# Patient Record
Sex: Male | Born: 1970 | Race: White | Hispanic: No | Marital: Married | Smoking: Never smoker
Health system: Southern US, Community
[De-identification: ages and names within clinical notes are randomized; demographics above are authoritative.]

## PROBLEM LIST (undated history)

## (undated) DIAGNOSIS — M1990 Unspecified osteoarthritis, unspecified site: Secondary | ICD-10-CM

## (undated) DIAGNOSIS — I10 Essential (primary) hypertension: Secondary | ICD-10-CM

## (undated) DIAGNOSIS — G4730 Sleep apnea, unspecified: Secondary | ICD-10-CM

## (undated) DIAGNOSIS — E669 Obesity, unspecified: Secondary | ICD-10-CM

## (undated) DIAGNOSIS — T7840XA Allergy, unspecified, initial encounter: Secondary | ICD-10-CM

## (undated) HISTORY — DX: Allergy, unspecified, initial encounter: T78.40XA

## (undated) HISTORY — DX: Unspecified osteoarthritis, unspecified site: M19.90

## (undated) HISTORY — DX: Essential (primary) hypertension: I10

## (undated) HISTORY — PX: NASAL SINUS SURGERY: SHX719

## (undated) HISTORY — DX: Obesity, unspecified: E66.9

---

## 2007-01-20 ENCOUNTER — Encounter: Admission: RE | Admit: 2007-01-20 | Discharge: 2007-01-20 | Payer: Self-pay | Admitting: Family Medicine

## 2007-03-10 ENCOUNTER — Ambulatory Visit (HOSPITAL_COMMUNITY): Admission: RE | Admit: 2007-03-10 | Discharge: 2007-03-11 | Payer: Self-pay | Admitting: Otolaryngology

## 2009-08-28 IMAGING — US US SCROTUM
1 series · 14 of 25 positions shown · non-contrast
Comparison: none

CLINICAL DATA: Nodularity of right superior scrotum. 
 SCROTAL ULTRASOUND:
 DOPPLER ULTRASOUND OF THE TESTICLES:
TECHNIQUE: Complete ultrasound examination of the testicles, epididymis, and other scrotal structures was performed.  Color and spectral Doppler ultrasound were also utilized to evaluate blood flow to the testicles.

[Series 1: us scrotum · 0.08mm/px · 14 of 52 slices shown]
[im 1/52]
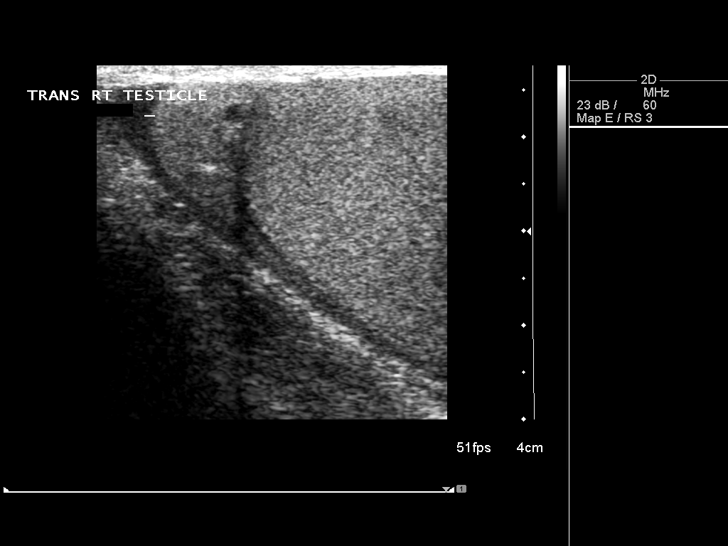
[im 5/52]
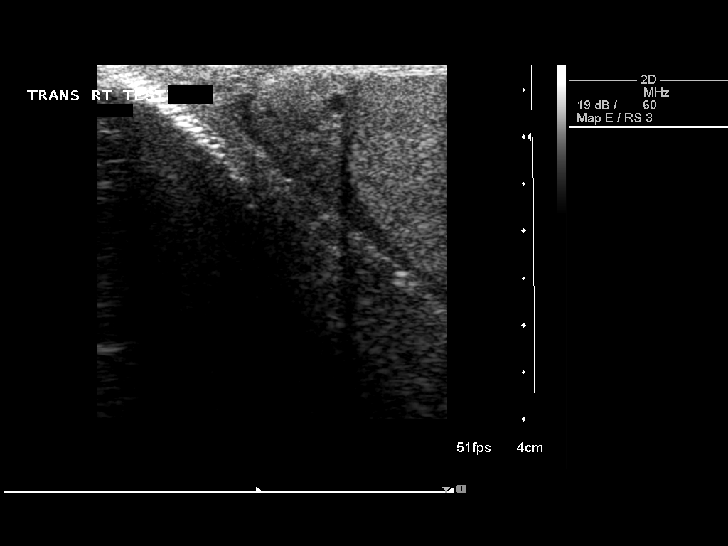
[im 9/52]
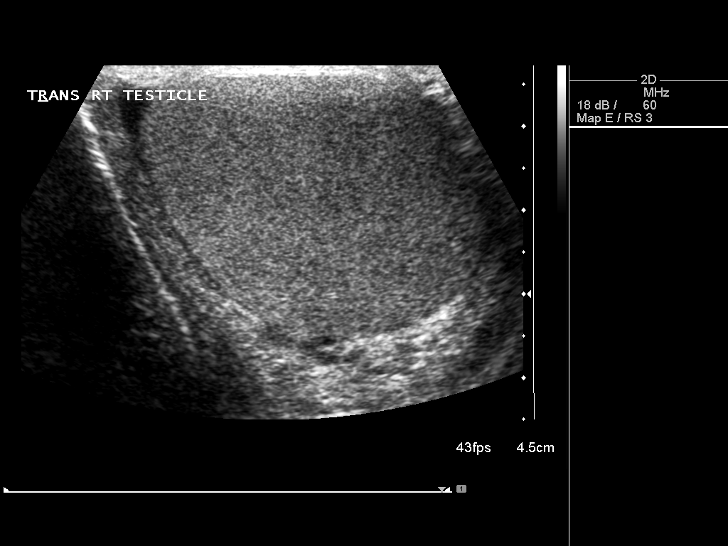
[im 13/52]
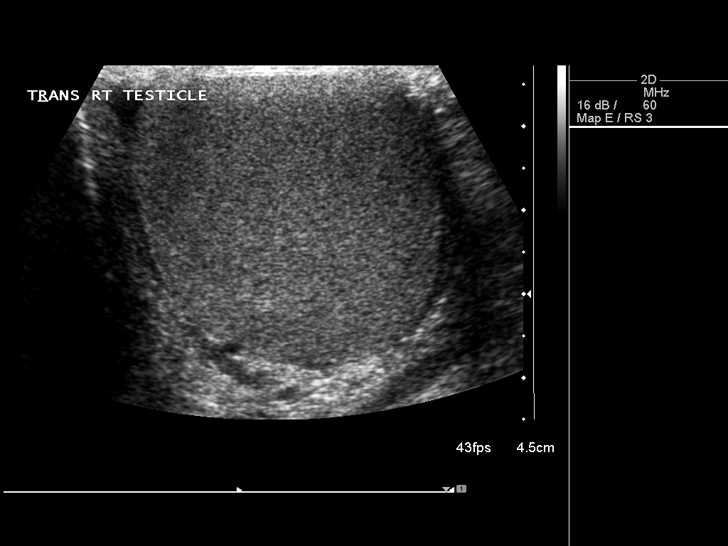
[im 18/52]
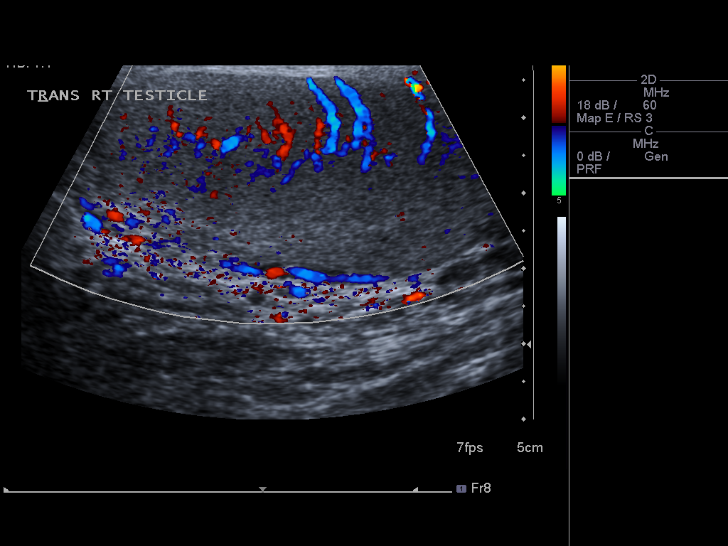
[im 20/52]
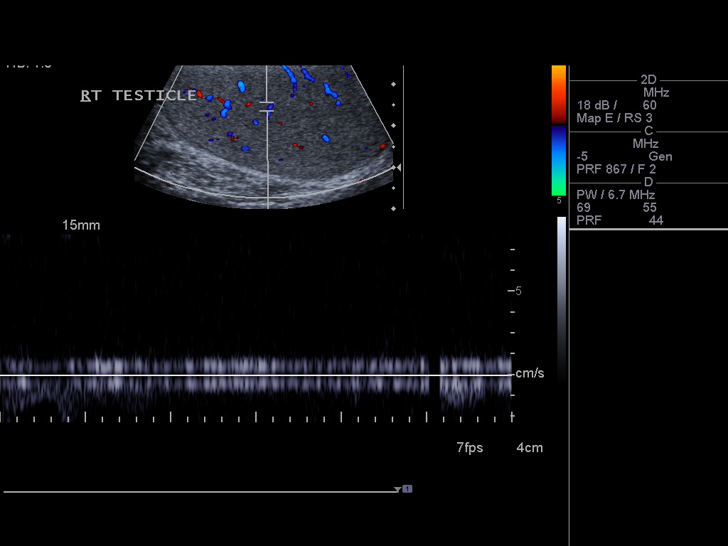
[im 24/52]
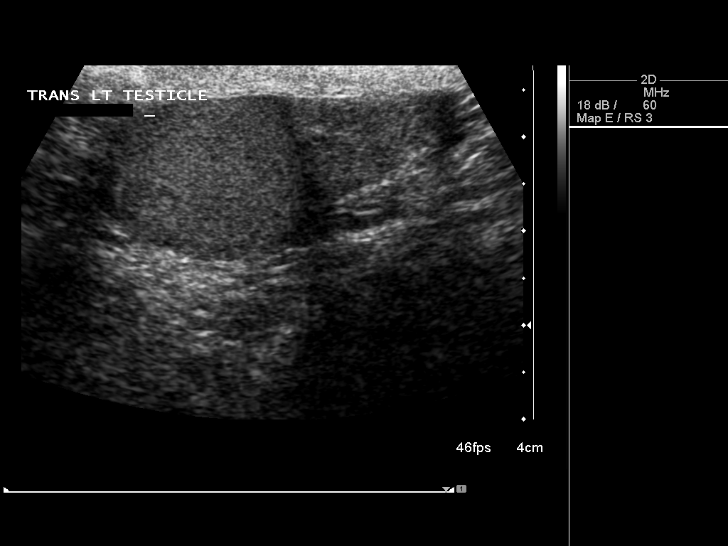
[im 28/52]
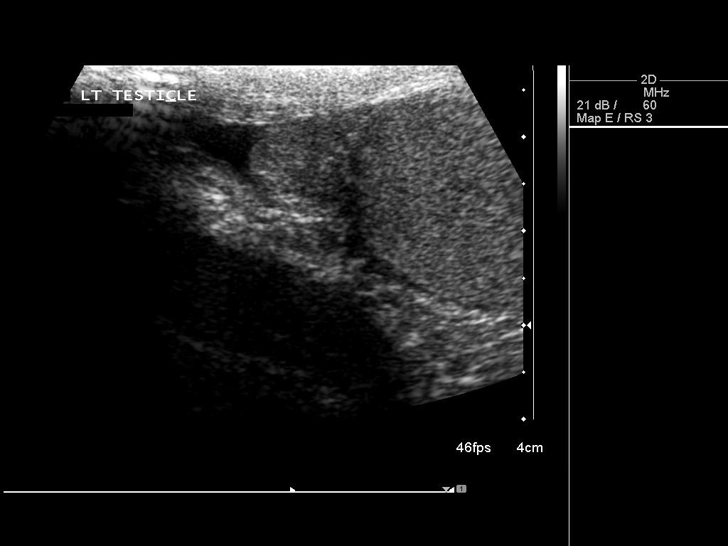
[im 32/52]
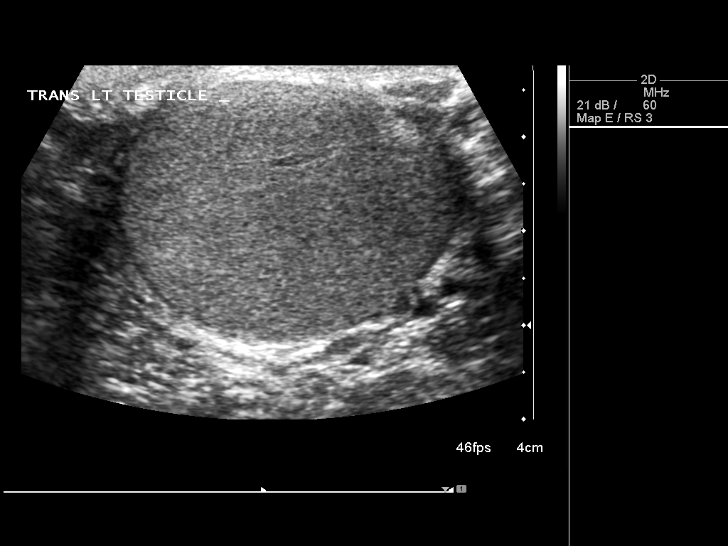
[im 35/52]
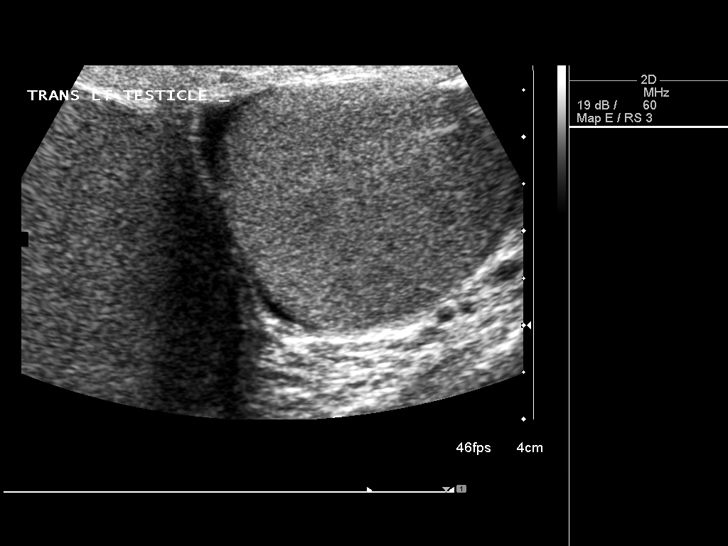
[im 39/52]
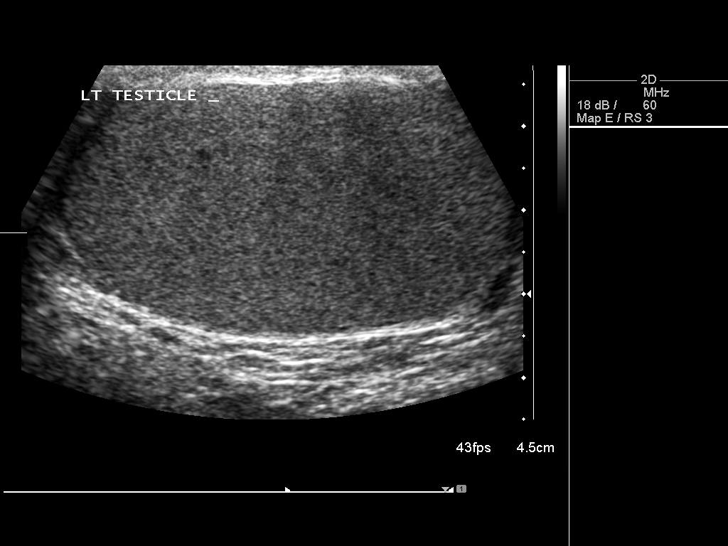
[im 43/52]
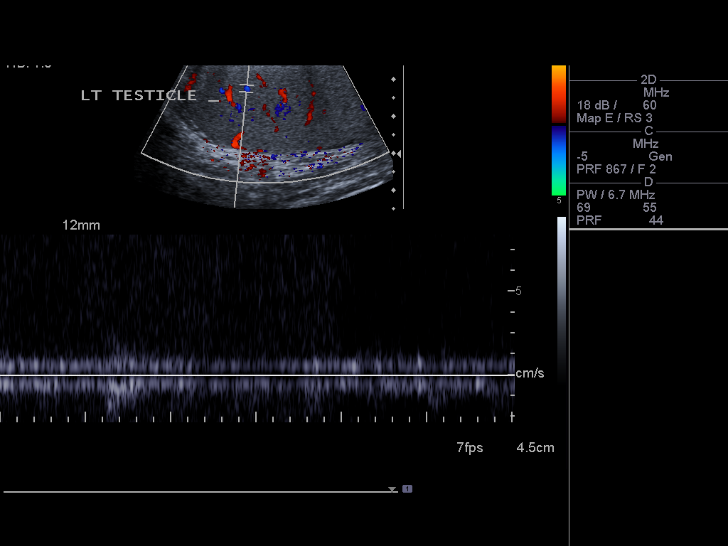
[im 47/52]
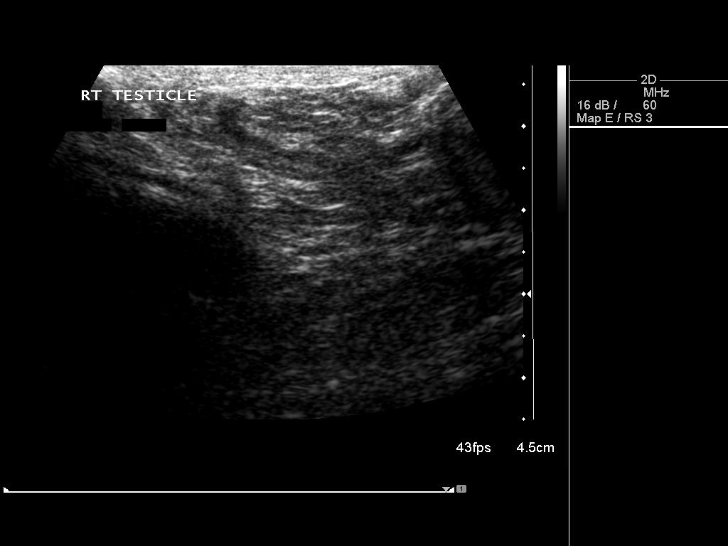
[im 52/52]
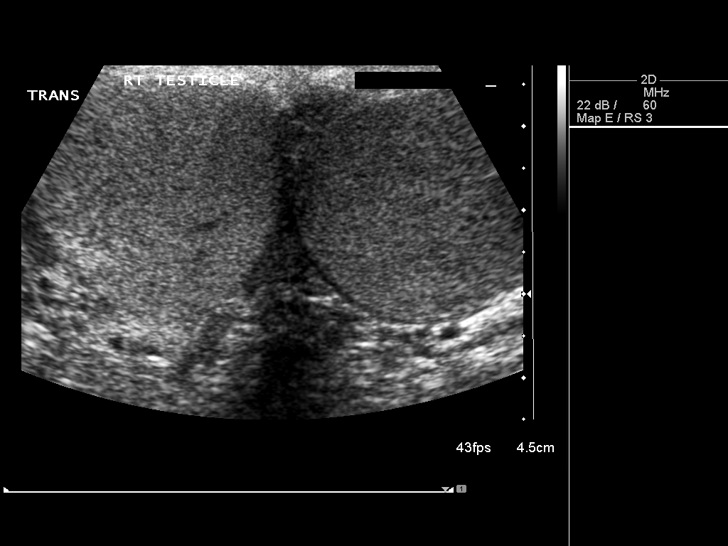

[14 of 25 positions shown; findings below may reference images not displayed]

FINDINGS: Both testicles are normal in size and echogenicity.  There is blood flow to both testicles and arterial and venous waveforms are noted.  Probable small epididymal cysts are noted bilaterally of no more than 2 mm.  No hydrocele or varicocele is seen.  At the site questioned, there is a vessel noted but no cystic or solid lesion is identified.
IMPRESSION: 1.  No intratesticular abnormality.  There is blood flow to both testicles. 
 2.  At the site questioned clinically only a vessel is noted.

## 2010-05-22 ENCOUNTER — Encounter: Payer: Self-pay | Admitting: Family Medicine

## 2010-09-12 NOTE — Op Note (Signed)
NAME:  Larry Paul, Larry Paul NO.:  [id number]   MEDICAL RECORD NO.:  [id number]          PATIENT TYPE:  AMB   LOCATION:  SDS                          FACILITY:  [facility]   PHYSICIAN:  Antony Contras, MD     DATE OF BIRTH:  1970/10/11   DATE OF PROCEDURE:  03/10/2007  DATE OF DISCHARGE:                               OPERATIVE REPORT   PREOPERATIVE DIAGNOSES:  1. Obstructive sleep apnea.  2. Bilateral inferior turbinate hypertrophy.   POSTOPERATIVE DIAGNOSIS:  1. Obstructive sleep apnea.  2. Bilateral inferior turbinate hypertrophy.  3. Septal deviation.   PROCEDURE:  Septoplasty and bilateral inferior turbinate submucous  resection.   SURGEON:  Antony Contras, M.D.   ANESTHESIA:  General endotracheal anesthesia.   COMPLICATIONS:  None.   INDICATIONS:  The patient is a 40 year old white male with history of  obstructive sleep apnea.  He uses CPAP at night.  He is having a hard  time tolerating CPAP because of nasal obstruction.  He was found to have  enlarged turbinates in the office and presents to the operating room for  surgical management.   FINDINGS:  The inferior turbinates were found to be enlarged, but upon  decongestion, a very large right-sided septal spur was noted  posteriorly.   DESCRIPTION OF PROCEDURE:  The patient was identified in the holding  room, and informed consent having been obtained including the discussion  of risks, benefits, and alternatives, the patient was brought to the  operative suite and put on the operating table in the supine position.  Anesthesia was induced, and the patient was intubated by the anesthesia  team without difficulty.  The patient was given intravenous antibiotics  during the case.  The eyes were taped closed, and the nose was then  draped.  Afrin soaked pledgets were placed on both sides of the nose and  left for several minutes.  These were then removed, and the nose was  inspected.  This is when the septal  spur was noted.  Thus, the inferior  turbinates and septum were then injected with 1% lidocaine with  1:100,000 epinephrine on both sides.   The Killian incision was made on the right side using a 15 blade  scalpel, and a subperichondrial flap was then elevated using [employer].  This was elevated above and below the septal spur.  Elevation was then performed over the spur.  A rent was made in the  mucosa in this process.  The Freer elevator was then used to divide the  cartilage in front of the spur, and soft tissues were elevated off the  opposite side.  The cartilage portion was then removed using Takahashi  forceps.  The bony portion was then released using an osteotome and  removed with Takahashi forceps.  The mucosa was then redraped.  The  inferior turbinates were then incised in the anterior head using a 15  blade scalpel on both sides.  A Freer elevator was used to elevate the  soft tissues off the underlying bone on both sides.  The microdebrider  was then used to remove submucosal tissue, keeping the overlying mucosa  and the underlying bone intact.  Soft tissues were then redraped on both  sides.  The nose was suctioned, and Doyle stents were placed in both  sides of the nose.  The Killian incision was closed with 4-0 chromic  suture in a simple interrupted fashion.  The stents were then put into  position and secured with a single 2-0 chromic through-and-through  stitch.  The nose and throat were then suctioned, and a bite guard was  placed.   The patient was then returned to anesthesia for wake-up and was  extubated and moved to the recovery room in stable condition.      Antony Contras, MD  Electronically Signed     DDB/MEDQ  D:  03/10/2007  T:  03/10/2007  Job:  [id number]

## 2011-02-06 LAB — CBC
Platelets: 243
RDW: 13.6
WBC: 6.8

## 2012-08-14 ENCOUNTER — Ambulatory Visit (INDEPENDENT_AMBULATORY_CARE_PROVIDER_SITE_OTHER): Payer: Managed Care, Other (non HMO) | Admitting: Family Medicine

## 2012-08-14 ENCOUNTER — Encounter: Payer: Self-pay | Admitting: Family Medicine

## 2012-08-14 ENCOUNTER — Ambulatory Visit (INDEPENDENT_AMBULATORY_CARE_PROVIDER_SITE_OTHER): Payer: Managed Care, Other (non HMO)

## 2012-08-14 VITALS — BP 128/82 | HR 68 | Temp 97.7°F | Ht 69.0 in | Wt 262.0 lb

## 2012-08-14 DIAGNOSIS — M79672 Pain in left foot: Secondary | ICD-10-CM

## 2012-08-14 DIAGNOSIS — E669 Obesity, unspecified: Secondary | ICD-10-CM

## 2012-08-14 DIAGNOSIS — I10 Essential (primary) hypertension: Secondary | ICD-10-CM | POA: Insufficient documentation

## 2012-08-14 DIAGNOSIS — M79609 Pain in unspecified limb: Secondary | ICD-10-CM

## 2012-08-14 DIAGNOSIS — R7402 Elevation of levels of lactic acid dehydrogenase (LDH): Secondary | ICD-10-CM

## 2012-08-14 DIAGNOSIS — R748 Abnormal levels of other serum enzymes: Secondary | ICD-10-CM

## 2012-08-14 DIAGNOSIS — E785 Hyperlipidemia, unspecified: Secondary | ICD-10-CM

## 2012-08-14 LAB — COMPLETE METABOLIC PANEL WITH GFR
ALT: 56 U/L — ABNORMAL HIGH (ref 0–53)
AST: 32 U/L (ref 0–37)
Albumin: 4.7 g/dL (ref 3.5–5.2)
Alkaline Phosphatase: 51 U/L (ref 39–117)
BUN: 16 mg/dL (ref 6–23)
CO2: 29 mEq/L (ref 19–32)
Calcium: 10.1 mg/dL (ref 8.4–10.5)
Chloride: 102 mEq/L (ref 96–112)
Creat: 1.03 mg/dL (ref 0.50–1.35)
GFR, Est African American: >89 mL/min
GFR, Est Non African American: 89 mL/min
Glucose, Bld: 84 mg/dL (ref 70–99)
Potassium: 4.4 mEq/L (ref 3.5–5.3)
Sodium: 137 mEq/L (ref 135–145)
Total Bilirubin: 0.5 mg/dL (ref 0.3–1.2)
Total Protein: 7.3 g/dL (ref 6.0–8.3)

## 2012-08-14 NOTE — Patient Instructions (Signed)
Plantar Fasciitis Plantar fasciitis is a common condition that causes foot pain. It is soreness (inflammation) of the band of tough fibrous tissue on the bottom of the foot that runs from the heel bone (calcaneus) to the ball of the foot. The cause of this soreness may be from excessive standing, poor fitting shoes, running on hard surfaces, being overweight, having an abnormal walk, or overuse (this is common in runners) of the painful foot or feet. It is also common in aerobic exercise dancers and ballet dancers. SYMPTOMS  Most people with plantar fasciitis complain of:  Severe pain in the morning on the bottom of their foot especially when taking the first steps out of bed. This pain recedes after a few minutes of walking.  Severe pain is experienced also during walking following a long period of inactivity.  Pain is worse when walking barefoot or up stairs DIAGNOSIS   Your caregiver will diagnose this condition by examining and feeling your foot.  Special tests such as X-rays of your foot, are usually not needed. PREVENTION   Consult a sports medicine professional before beginning a new exercise program.  Walking programs offer a good workout. With walking there is a lower chance of overuse injuries common to runners. There is less impact and less jarring of the joints.  Begin all new exercise programs slowly. If problems or pain develop, decrease the amount of time or distance until you are at a comfortable level.  Wear good shoes and replace them regularly.  Stretch your foot and the heel cords at the back of the ankle (Achilles tendon) both before and after exercise.  Run or exercise on even surfaces that are not hard. For example, asphalt is better than pavement.  Do not run barefoot on hard surfaces.  If using a treadmill, vary the incline.  Do not continue to workout if you have foot or joint problems. Seek professional help if they do not improve. HOME CARE INSTRUCTIONS     Avoid activities that cause you pain until you recover.  Use ice or cold packs on the problem or painful areas after working out.  Only take over-the-counter or prescription medicines for pain, discomfort, or fever as directed by your caregiver.  Soft shoe inserts or athletic shoes with air or gel sole cushions may be helpful.  If problems continue or become more severe, consult a sports medicine caregiver or your own health care provider. Cortisone is a potent anti-inflammatory medication that may be injected into the painful area. You can discuss this treatment with your caregiver. MAKE SURE YOU:   Understand these instructions.  Will watch your condition.  Will get help right away if you are not doing well or get worse. Document Released: 01/09/2001 Document Revised: 07/09/2011 Document Reviewed: 03/10/2008 [facility] Patient Information 2013 [place], [place].  Hypertriglyceridemia  Diet for High blood levels of Triglycerides Most fats in food are triglycerides. Triglycerides in your blood are stored as fat in your body. High levels of triglycerides in your blood may put you at a greater risk for heart disease and stroke.  Normal triglyceride levels are less than 150 mg/dL. Borderline high levels are 150-199 mg/dl. High levels are 200 - 499 mg/dL, and very high triglyceride levels are greater than 500 mg/dL. The decision to treat high triglycerides is generally based on the level. For people with borderline or high triglyceride levels, treatment includes weight loss and exercise. Drugs are recommended for people with very high triglyceride levels. Many people who need  treatment for high triglyceride levels have metabolic syndrome. This syndrome is a collection of disorders that often include: insulin resistance, high blood pressure, blood clotting problems, high cholesterol and triglycerides. TESTING PROCEDURE FOR TRIGLYCERIDES  You should not eat 4 hours before getting your  triglycerides measured. The normal range of triglycerides is between 10 and 250 milligrams per deciliter (mg/dl). Some people may have extreme levels (1000 or above), but your triglyceride level may be too high if it is above 150 mg/dl, depending on what other risk factors you have for heart disease.  People with high blood triglycerides may also have high blood cholesterol levels. If you have high blood cholesterol as well as high blood triglycerides, your risk for heart disease is probably greater than if you only had high triglycerides. High blood cholesterol is one of the main risk factors for heart disease. CHANGING YOUR DIET  Your weight can affect your blood triglyceride level. If you are more than 20% above your ideal body weight, you may be able to lower your blood triglycerides by losing weight. Eating less and exercising regularly is the best way to combat this. Fat provides more calories than any other food. The best way to lose weight is to eat less fat. Only 30% of your total calories should come from fat. Less than 7% of your diet should come from saturated fat. A diet low in fat and saturated fat is the same as a diet to decrease blood cholesterol. By eating a diet lower in fat, you may lose weight, lower your blood cholesterol, and lower your blood triglyceride level.  Eating a diet low in fat, especially saturated fat, may also help you lower your blood triglyceride level. Ask your dietitian to help you figure how much fat you can eat based on the number of calories your caregiver has prescribed for you.  Exercise, in addition to helping with weight loss may also help lower triglyceride levels.   Alcohol can increase blood triglycerides. You may need to stop drinking alcoholic beverages.  Too much carbohydrate in your diet may also increase your blood triglycerides. Some complex carbohydrates are necessary in your diet. These may include bread, rice, potatoes, other starchy vegetables and  cereals.  Reduce "simple" carbohydrates. These may include pure sugars, candy, honey, and jelly without losing other nutrients. If you have the kind of high blood triglycerides that is affected by the amount of carbohydrates in your diet, you will need to eat less sugar and less high-sugar foods. Your caregiver can help you with this.  Adding 2-4 grams of fish oil (EPA+ DHA) may also help lower triglycerides. Speak with your caregiver before adding any supplements to your regimen. Following the Diet  Maintain your ideal weight. Your caregivers can help you with a diet. Generally, eating less food and getting more exercise will help you lose weight. Joining a weight control group may also help. Ask your caregivers for a good weight control group in your area.  Eat low-fat foods instead of high-fat foods. This can help you lose weight too.  These foods are lower in fat. Eat MORE of these:   Dried beans, peas, and lentils.  Egg whites.  Low-fat cottage cheese.  Fish.  Lean cuts of meat, such as round, sirloin, rump, and flank (cut extra fat off meat you fix).  Whole grain breads, cereals and pasta.  Skim and nonfat dry milk.  Low-fat yogurt.  Poultry without the skin.  Cheese made with skim or part-skim milk,  such as mozzarella, parmesan, farmers', ricotta, or pot cheese. These are higher fat foods. Eat LESS of these:   Whole milk and foods made from whole milk, such as American, blue, cheddar, monterey jack, and swiss cheese  High-fat meats, such as luncheon meats, sausages, knockwurst, bratwurst, hot dogs, ribs, corned beef, ground pork, and regular ground beef.  Fried foods. Limit saturated fats in your diet. Substituting unsaturated fat for saturated fat may decrease your blood triglyceride level. You will need to read package labels to know which products contain saturated fats.  These foods are high in saturated fat. Eat LESS of these:   Fried pork skins.  Whole  milk.  Skin and fat from poultry.  Palm oil.  Butter.  Shortening.  Cream cheese.  Larry Paul.  Margarines and baked goods made from listed oils.  Vegetable shortenings.  Chitterlings.  Fat from meats.  Coconut oil.  Palm kernel oil.  Lard.  Cream.  Sour cream.  Fatback.  Coffee whiteners and non-dairy creamers made with these oils.  Cheese made from whole milk. Use unsaturated fats (both polyunsaturated and monounsaturated) moderately. Remember, even though unsaturated fats are better than saturated fats; you still want a diet low in total fat.  These foods are high in unsaturated fat:   Canola oil.  Sunflower oil.  Mayonnaise.  Almonds.  Peanuts.  Pine nuts.  Margarines made with these oils.  Safflower oil.  Olive oil.  Avocados.  Cashews.  Peanut butter.  Sunflower seeds.  Soybean oil.  Peanut oil.  Olives.  Pecans.  Walnuts.  Pumpkin seeds. Avoid sugar and other high-sugar foods. This will decrease carbohydrates without decreasing other nutrients. Sugar in your food goes rapidly to your blood. When there is excess sugar in your blood, your liver may use it to make more triglycerides. Sugar also contains calories without other important nutrients.  Eat LESS of these:   Sugar, brown sugar, powdered sugar, jam, jelly, preserves, honey, syrup, molasses, pies, candy, cakes, cookies, frosting, pastries, colas, soft drinks, punches, fruit drinks, and regular gelatin.  Avoid alcohol. Alcohol, even more than sugar, may increase blood triglycerides. In addition, alcohol is high in calories and low in nutrients. Ask for sparkling water, or a diet soft drink instead of an alcoholic beverage. Suggestions for planning and preparing meals   Bake, broil, grill or roast meats instead of frying.  Remove fat from meats and skin from poultry before cooking.  Add spices, herbs, lemon juice or vinegar to vegetables instead of salt, rich sauces or  gravies.  Use a non-stick skillet without fat or use no-stick sprays.  Cool and refrigerate stews and broth. Then remove the hardened fat floating on the surface before serving.  Refrigerate meat drippings and skim off fat to make low-fat gravies.  Serve more fish.  Use less butter, margarine and other high-fat spreads on bread or vegetables.  Use skim or reconstituted non-fat dry milk for cooking.  Cook with low-fat cheeses.  Substitute low-fat yogurt or cottage cheese for all or part of the sour cream in recipes for sauces, dips or congealed salads.  Use half yogurt/half mayonnaise in salad recipes.  Substitute evaporated skim milk for cream. Evaporated skim milk or reconstituted non-fat dry milk can be whipped and substituted for whipped cream in certain recipes.  Choose fresh fruits for dessert instead of high-fat foods such as pies or cakes. Fruits are naturally low in fat. When Dining Out   Order low-fat appetizers such as fruit or vegetable juice,  pasta with vegetables or tomato sauce.  Select clear, rather than cream soups.  Ask that dressings and gravies be served on the side. Then use less of them.  Order foods that are baked, broiled, poached, steamed, stir-fried, or roasted.  Ask for margarine instead of butter, and use only a small amount.  Drink sparkling water, unsweetened tea or coffee, or diet soft drinks instead of alcohol or other sweet beverages. QUESTIONS AND ANSWERS ABOUT OTHER FATS IN THE BLOOD: SATURATED FAT, TRANS FAT, AND CHOLESTEROL What is trans fat? Trans fat is a type of fat that is formed when vegetable oil is hardened through a process called hydrogenation. This process helps makes foods more solid, gives them shape, and prolongs their shelf life. Trans fats are also called hydrogenated or partially hydrogenated oils.  What do saturated fat, trans fat, and cholesterol in foods have to do with heart disease? Saturated fat, trans fat, and  cholesterol in the diet all raise the level of LDL "bad" cholesterol in the blood. The higher the LDL cholesterol, the greater the risk for coronary heart disease (CHD). Saturated fat and trans fat raise LDL similarly.  What foods contain saturated fat, trans fat, and cholesterol? High amounts of saturated fat are found in animal products, such as fatty cuts of meat, chicken skin, and full-fat dairy products like butter, whole milk, cream, and cheese, and in tropical vegetable oils such as palm, palm kernel, and coconut oil. Trans fat is found in some of the same foods as saturated fat, such as vegetable shortening, some margarines (especially hard or stick margarine), crackers, cookies, baked goods, fried foods, salad dressings, and other processed foods made with partially hydrogenated vegetable oils. Small amounts of trans fat also occur naturally in some animal products, such as milk products, beef, and lamb. Foods high in cholesterol include liver, other organ meats, egg yolks, shrimp, and full-fat dairy products. How can I use the new food label to make heart-healthy food choices? Check the Nutrition Facts panel of the food label. Choose foods lower in saturated fat, trans fat, and cholesterol. For saturated fat and cholesterol, you can also use the Percent Daily Value (%DV): 5% DV or less is low, and 20% DV or more is high. (There is no %DV for trans fat.) Use the Nutrition Facts panel to choose foods low in saturated fat and cholesterol, and if the trans fat is not listed, read the ingredients and limit products that list shortening or hydrogenated or partially hydrogenated vegetable oil, which tend to be high in trans fat. POINTS TO REMEMBER:   Discuss your risk for heart disease with your caregivers, and take steps to reduce risk factors.  Change your diet. Choose foods that are low in saturated fat, trans fat, and cholesterol.  Add exercise to your daily routine if it is not already being done.  Participate in physical activity of moderate intensity, like brisk walking, for at least 30 minutes on most, and preferably all days of the week. No time? Break the 30 minutes into three, 10-minute segments during the day.  Stop smoking. If you do smoke, contact your caregiver to discuss ways in which they can help you quit.  Do not use street drugs.  Maintain a normal weight.  Maintain a healthy blood pressure.  Keep up with your blood work for checking the fats in your blood as directed by your caregiver. Document Released: 02/02/2004 Document Revised: 10/16/2011 Document Reviewed: 08/30/2008 [facility] Patient Information 2013 [place], [place].

## 2012-08-14 NOTE — Progress Notes (Signed)
Patient ID: Larry Paul, male   DOB: March 27, 1971, 42 y.o.   MRN: [id number] SUBJECTIVE: HPI: Patient comes in with left foot pain close to the left heel. In the area of the arches.. Wears flat loafers. When he stands a lot it is worse. Comes and goes. No injury or trauma.no swelling , no history of gout.   PMH/PSH: reviewed/updated in Epic  SH/FH: reviewed/updated in Epic  Allergies: reviewed/updated in Epic  Medications: reviewed/updated in Epic  Immunizations: reviewed/updated in Epic  ROS: As above in the HPI. All other systems are stable or negative.  OBJECTIVE: APPEARANCE: Obese white male. Patient in no acute distress.The patient appeared well nourished and normally developed. Acyanotic. Waist: VITAL SIGNS:BP 128/82  Pulse 68  Temp(Src) 97.7 F (36.5 C) (Oral)  Ht 5\' 9"  (1.753 m)  Wt 262 lb (118.842 kg)  BMI 38.67 kg/m2   SKIN: warm and  Dry without overt rashes, tattoos and scars  HEAD and Neck: without JVD, Head and scalp: normal Eyes:No scleral icterus. Fundi normal, eye movements normal. Ears: Auricle normal, canal normal, Tympanic membranes normal, insufflation normal. Nose: normal Throat: normal Neck & thyroid: normal  CHEST & LUNGS: Chest wall: normal Lungs: Clear  CVS: Reveals the PMI to be normally located. Regular rhythm, First and Second Heart sounds are normal,  absence of murmurs, rubs or gallops.  Peripheral vasculature: Radial pulses: normal Dorsal pedis pulses: normal Posterior pulses: normal  ABDOMEN:  Appearance: normal Benign,, no organomegaly, no masses, no Abdominal Aortic enlargement. No Guarding , no rebound. No Bruits. Bowel sounds: normal  RECTAL: N/A GU: N/A   MUSCULOSKELETAL:  Spine: normal Joints: intact Left Foot: tenderness along the proximal arch of the left foot.  EXTREMETIES: nonedematous. Both Femoral and Pedal pulses are normal.  NEUROLOGIC: oriented to time,place and person; nonfocal. Strength is  normal Sensory is normal Reflexes are normal Cranial Nerves are normal.  ASSESSMENT: Foot pain, left - Plantar fasciitis - Plan: DG Foot Complete Left  HTN (hypertension) - Plan: COMPLETE METABOLIC PANEL WITH GFR  Obesity, unspecified  Dyslipidemia - Plan: COMPLETE METABOLIC PANEL WITH GFR, NMR Lipoprofile with Lipids  Abnormal transaminases - Plan: COMPLETE METABOLIC PANEL WITH GFR, Hepatitis panel, acute   PLAN: Foot stretches, hand outs on Plantar fasciitis & hyperlipidemia.  [facility] reading (PRIMARY) by  Dr. Modesto Charon: Preliminary: normal foot Xray.                          Orders Placed This Encounter  Procedures  . DG Foot Complete Left    Standing Status: Future     Number of Occurrences: 1     Standing Expiration Date: 10/14/2013    Order Specific Question:  Reason for Exam (SYMPTOM  OR DIAGNOSIS REQUIRED)    Answer:  PAIN    Order Specific Question:  Preferred imaging location?    Answer:  Internal  . COMPLETE METABOLIC PANEL WITH GFR  . NMR Lipoprofile with Lipids  . Hepatitis panel, acute   Recommend the purchase and use of shoe inserts.especially arch suports. Reviewed his chart and patient did not comply with follow up since 2012 especially on his abnormal lab results. RTc in 6 weeks for  Recheck.  Kayah Hecker P. Modesto Charon, M.D.

## 2012-08-15 ENCOUNTER — Encounter: Payer: Self-pay | Admitting: Family Medicine

## 2012-08-15 LAB — HEPATITIS PANEL, ACUTE
HCV Ab: NEGATIVE
Hep A IgM: NEGATIVE
Hep B C IgM: NEGATIVE
Hepatitis B Surface Ag: NEGATIVE

## 2012-08-15 LAB — NMR LIPOPROFILE WITH LIPIDS
Cholesterol, Total: 139 mg/dL (ref <200)
HDL Particle Number: 29.9 umol/L — ABNORMAL LOW (ref >=30.5)
HDL Size: 8.7 nm — ABNORMAL LOW (ref >=9.2)
HDL-C: 37 mg/dL — ABNORMAL LOW (ref >=40)
LDL (calc): 67 mg/dL (ref <100)
LDL Particle Number: 996 nmol/L (ref <1000)
LDL Size: 19.8 nm — ABNORMAL LOW (ref >20.5)
LP-IR Score: 72 — ABNORMAL HIGH (ref <=45)
Large HDL-P: 3.4 umol/L — ABNORMAL LOW (ref >=4.8)
Large VLDL-P: 7.7 nmol/L — ABNORMAL HIGH (ref <=2.7)
Small LDL Particle Number: 750 nmol/L — ABNORMAL HIGH (ref <=527)
Triglycerides: 173 mg/dL — ABNORMAL HIGH (ref <150)
VLDL Size: 51.9 nm — ABNORMAL HIGH (ref <=46.6)

## 2012-08-15 NOTE — Progress Notes (Signed)
Quick Note:  Labs abnormal. Abnormal lipids. Doesn't need medications at present. The liver enzyme was better than last year and the hepatitis panel was negative. He needs to diet and exercise as we discussed and follow up as we discussed. Needs an annual physical in 3 months or so. Thanks ______

## 2012-09-25 ENCOUNTER — Ambulatory Visit: Payer: Managed Care, Other (non HMO) | Admitting: Family Medicine

## 2012-11-28 ENCOUNTER — Ambulatory Visit: Payer: Self-pay | Admitting: Family Medicine

## 2014-04-06 ENCOUNTER — Telehealth: Payer: Self-pay | Admitting: Family Medicine

## 2014-04-06 NOTE — Telephone Encounter (Signed)
[facility] 10:45 [facility]

## 2014-04-09 ENCOUNTER — Ambulatory Visit: Payer: Managed Care, Other (non HMO) | Admitting: Family Medicine

## 2014-05-17 ENCOUNTER — Encounter: Payer: Self-pay | Admitting: *Deleted

## 2014-06-23 ENCOUNTER — Other Ambulatory Visit: Payer: Managed Care, Other (non HMO) | Admitting: Family Medicine

## 2014-06-25 ENCOUNTER — Ambulatory Visit (INDEPENDENT_AMBULATORY_CARE_PROVIDER_SITE_OTHER): Payer: Managed Care, Other (non HMO) | Admitting: Family Medicine

## 2014-06-25 ENCOUNTER — Encounter: Payer: Self-pay | Admitting: Family Medicine

## 2014-06-25 VITALS — BP 127/81 | HR 79 | Temp 97.1°F | Ht 69.0 in | Wt 256.0 lb

## 2014-06-25 DIAGNOSIS — Z0000 Encounter for general adult medical examination without abnormal findings: Secondary | ICD-10-CM | POA: Diagnosis not present

## 2014-06-25 DIAGNOSIS — I10 Essential (primary) hypertension: Secondary | ICD-10-CM

## 2014-06-25 NOTE — Progress Notes (Signed)
   Subjective:    Patient ID: Larry Paul, male    DOB: 08-Feb-1971, 44 y.o.   MRN: [id number]  HPI 44 year old gentleman here for a physical. He was treated previously for hypertension but has not taken the medicine in recent days. He has been trying to lose weight by watching his diet. He has no specific complaints or issues but just felt like he needed to have a physical exam. We talked for a while about preventive care healthy lifestyles diets where they relate to fat and carbohydrate intake and how that may influence weight loss insulin levels etc. Family history is essentially negative.    Review of Systems  Constitutional: Negative.   HENT: Negative.   Eyes: Negative.   Respiratory: Negative.  Negative for shortness of breath.   Cardiovascular: Negative.  Negative for chest pain and leg swelling.  Gastrointestinal: Negative.   Genitourinary: Negative.   Musculoskeletal: Negative.   Skin: Negative.   Neurological: Negative.   Psychiatric/Behavioral: Negative.   All other systems reviewed and are negative.      Objective:   Physical Exam  Constitutional: He is oriented to person, place, and time. He appears well-developed and well-nourished.  HENT:  Head: Normocephalic and atraumatic.  Eyes: Pupils are equal, round, and reactive to light.  Neck: Normal range of motion. No thyromegaly present.  Cardiovascular: Normal rate and regular rhythm.   Pulmonary/Chest: Effort normal and breath sounds normal.  Abdominal: Soft. Bowel sounds are normal.  Genitourinary: Penis normal.  No hernia detected  Neurological: He is alert and oriented to person, place, and time. He has normal reflexes.  Skin: Skin is warm.  Psychiatric: He has a normal mood and affect. His behavior is normal.    BP 127/81 mmHg  Pulse 79  Temp(Src) 97.1 F (36.2 C) (Oral)  Ht $R'5\' 9"'Cb$  (1.753 m)  Wt 256 lb (116.121 kg)  BMI 37.79 kg/m2        Assessment & Plan:  1. Essential hypertension BP is good  today.  Will follow for now; have asked pt to monitor with his wrist BP machine - CMP14+EGFR; Future  2. Annual physical exam  - HIV antibody (with reflex) - Lipid panel;   Wardell Honour MD

## 2014-06-30 ENCOUNTER — Other Ambulatory Visit: Payer: Managed Care, Other (non HMO)

## 2014-07-01 ENCOUNTER — Other Ambulatory Visit: Payer: Managed Care, Other (non HMO)

## 2015-03-23 IMAGING — CR DG FOOT COMPLETE 3+V*L*
3 series · 3 of 3 positions shown · non-contrast
Comparison: None.

CLINICAL DATA: Pain

LEFT FOOT - COMPLETE 3+ VIEW

[view not recorded (1 of 3)]
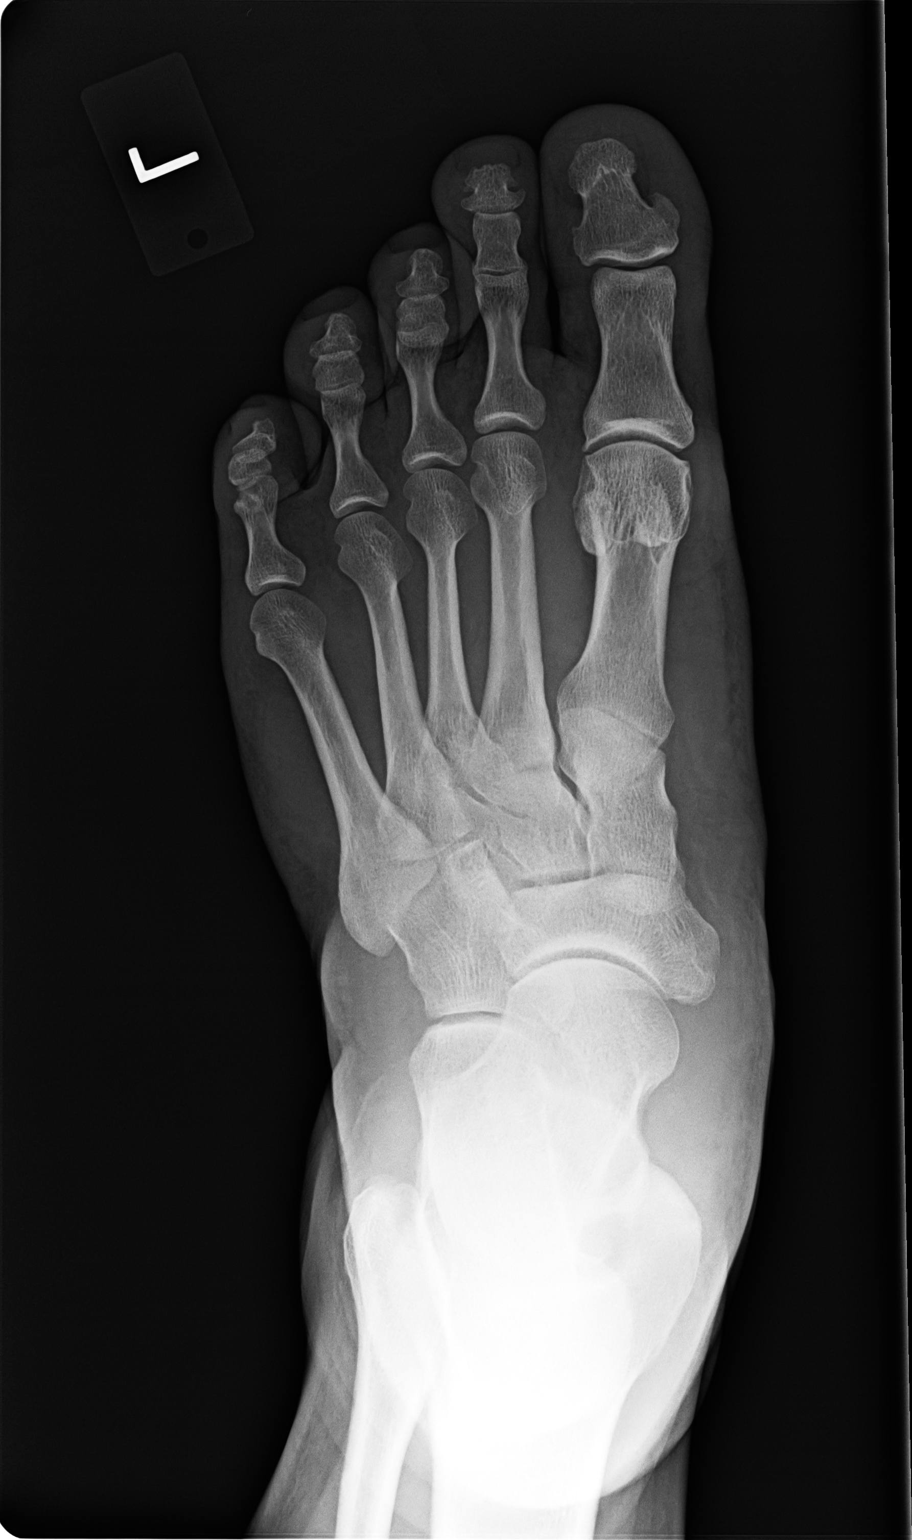

[view not recorded (2 of 3)]
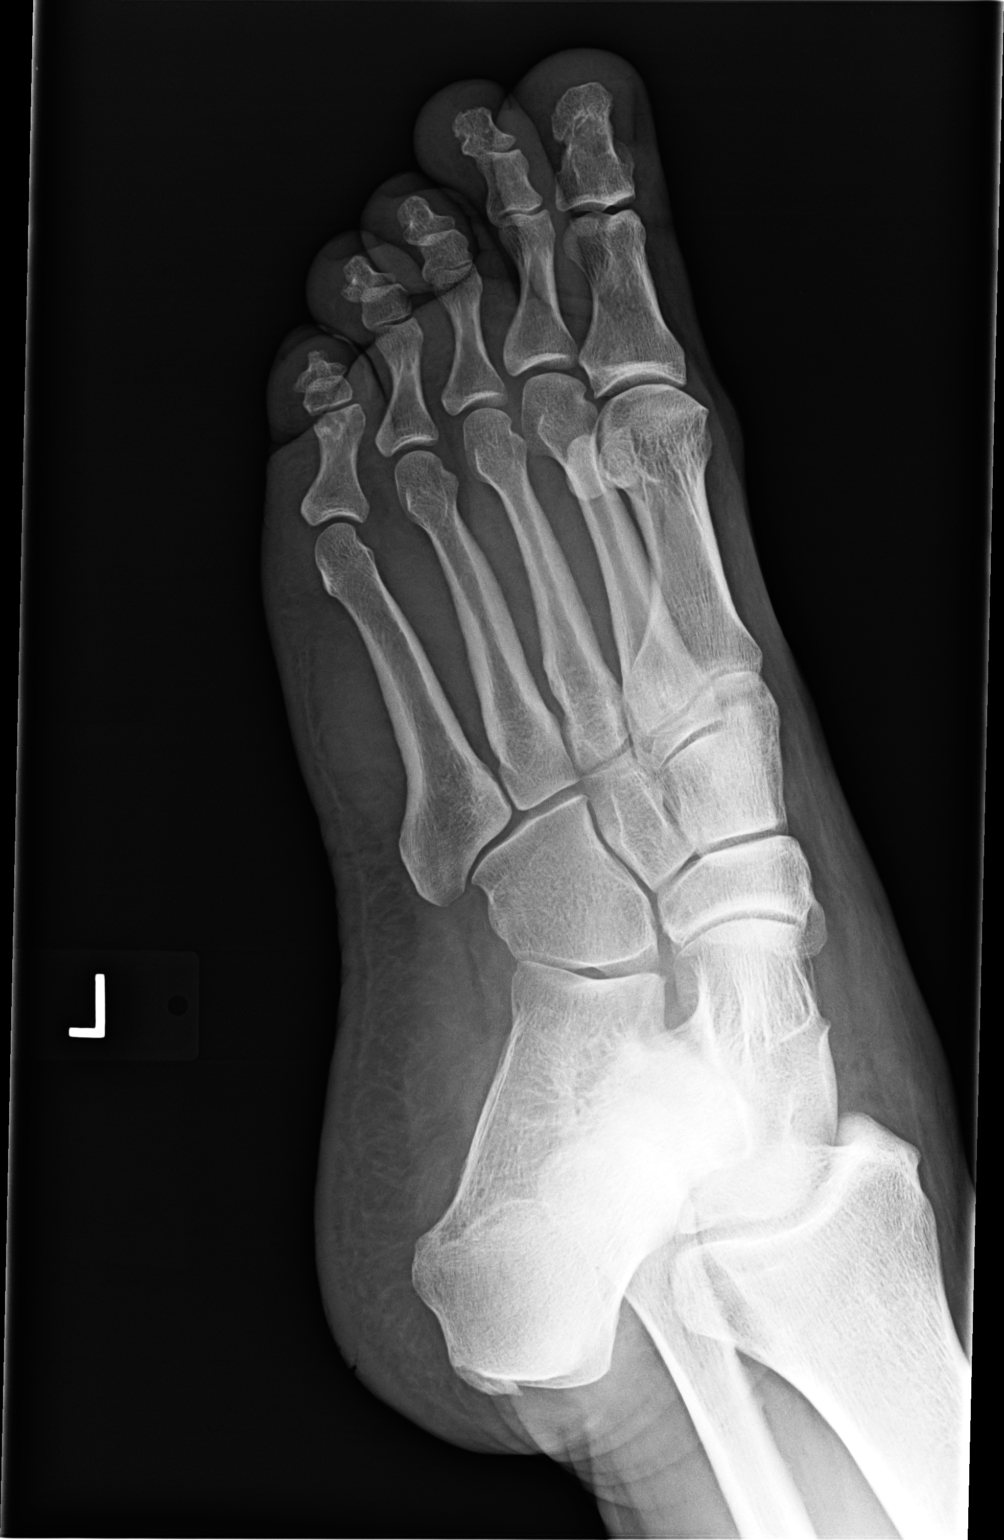

[view not recorded (3 of 3)]
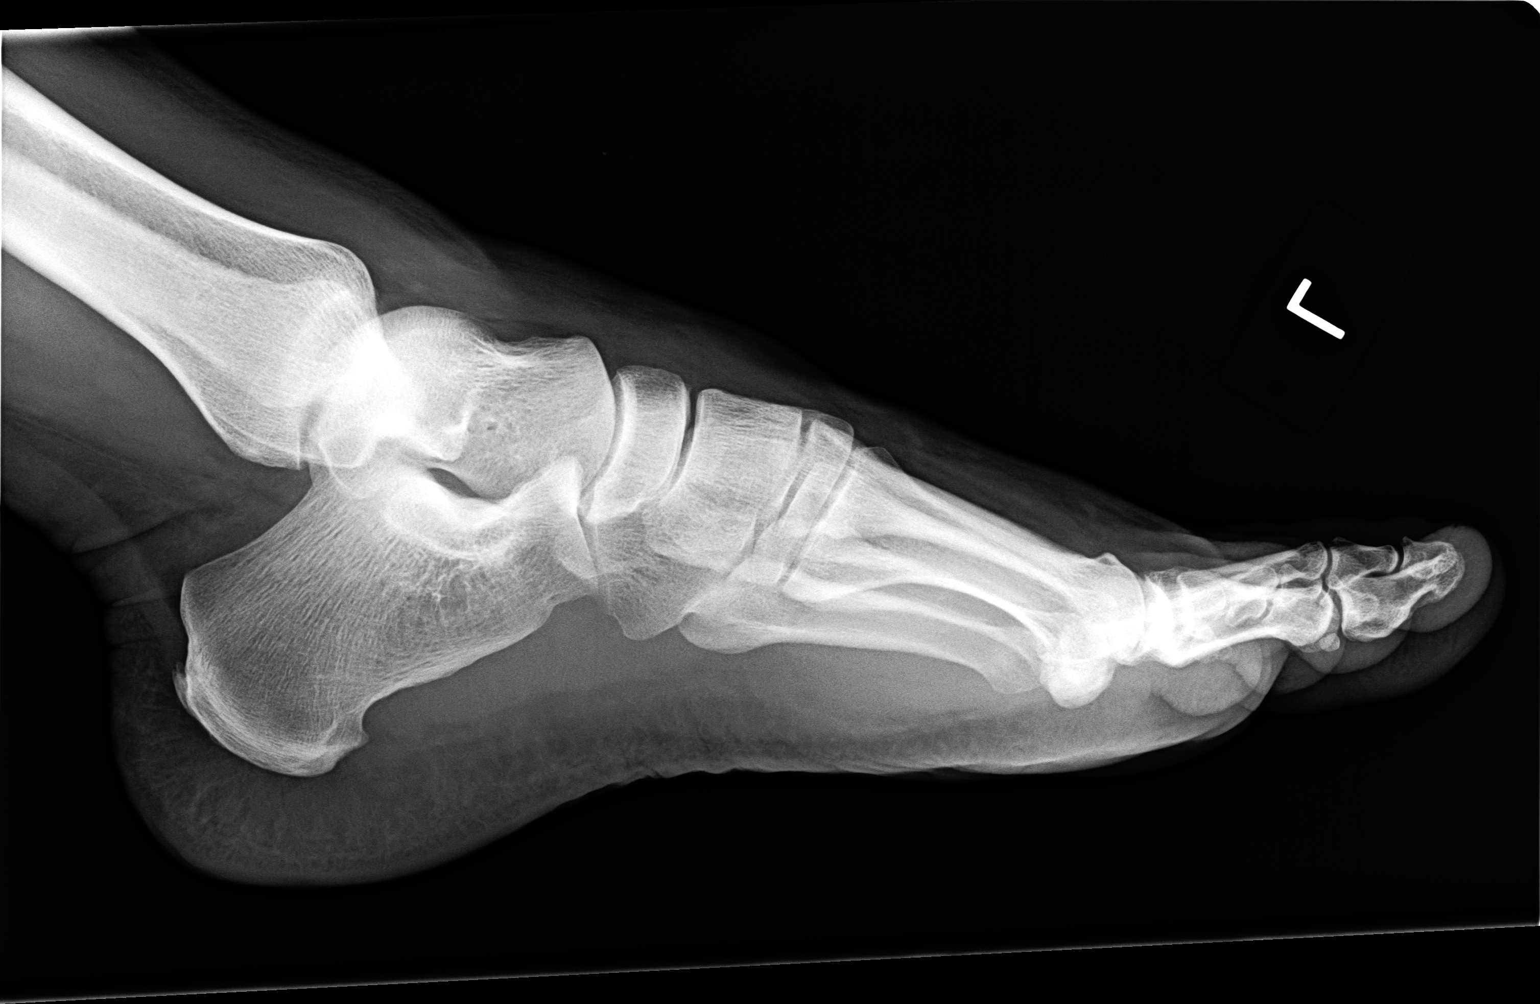

[3 of 3 positions shown; findings below may reference images not displayed]

FINDINGS: Tarsal - metatarsal alignment is normal.  Joint spaces
appear normal.  No fracture is seen.  No erosion is noted.
IMPRESSION: Negative.

## 2016-11-20 ENCOUNTER — Ambulatory Visit (INDEPENDENT_AMBULATORY_CARE_PROVIDER_SITE_OTHER): Payer: BLUE CROSS/BLUE SHIELD | Admitting: Family Medicine

## 2016-11-20 ENCOUNTER — Encounter: Payer: Self-pay | Admitting: Family Medicine

## 2016-11-20 VITALS — BP 138/86 | HR 63 | Temp 97.1°F | Ht 69.0 in | Wt 257.6 lb

## 2016-11-20 DIAGNOSIS — Z6838 Body mass index (BMI) 38.0-38.9, adult: Secondary | ICD-10-CM

## 2016-11-20 DIAGNOSIS — Z0000 Encounter for general adult medical examination without abnormal findings: Secondary | ICD-10-CM | POA: Diagnosis not present

## 2016-11-20 DIAGNOSIS — G4733 Obstructive sleep apnea (adult) (pediatric): Secondary | ICD-10-CM | POA: Insufficient documentation

## 2016-11-20 DIAGNOSIS — Z9989 Dependence on other enabling machines and devices: Secondary | ICD-10-CM

## 2016-11-20 NOTE — Patient Instructions (Signed)
Great to meet you!  Come back in 1 year unless you need us sooner.   I would be glad to help with your CPAP  Come back for fasting labs when you are ready.

## 2016-11-20 NOTE — Progress Notes (Signed)
   HPI  Patient presents today here for an annual physical exam.  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 He states that he has had his bills order and has not had a problem being laid off.  He would like to wait until his insurance is started again for annual labs.  He has sleep apnea, his sleep study was about 10 years ago at [facility], he would like to buy a new CPAP machine separate from insurance, he will need me to fill out a form.  Patient has been doing the keto diet and has lost him is 20 pounds. He has not been exercising regularly.    PMH: Smoking status noted ROS: Per HPI  Objective: BP 138/86   Pulse 63   Temp (!) 97.1 F (36.2 C) (Oral)   Ht '5\' 9"'$  (1.753 m)   Wt 257 lb 9.6 oz (116.8 kg)   BMI 38.04 kg/m  Gen: NAD, alert, cooperative with exam HEENT: NCAT, EOMI, PERRL CV: RRR, good S1/S2, no murmur Resp: CTABL, no wheezes, non-labored Abd: SNTND, BS present, no guarding or organomegaly on the small nontender umbilical hernia Ext: No edema, warm Neuro: Alert and oriented, strength 5/5 in bilateral lower extremities, 1+ symmetric patellar tendon reflex  Assessment and plan:  # Annual physical exam Normal exam except for obesity He has been aggressively watching his diet and is very happy with his weight loss. Annual lab orders placed.  # Obstructive sleep apnea Patient would like to buy a new machine, I'll be glad to fill out a form as needed, he is buying this independent of insurance and states it is an auto-titration.      Orders Placed This Encounter  Procedures  . CBC with Differential/Platelet    Standing Status:   Future    Standing Expiration Date:   11/20/2017  . CMP14+EGFR    Standing Status:   Future    Standing Expiration Date:   11/20/2017  . Lipid panel    Standing Status:   Future    Standing Expiration Date:   11/20/2017  . TSH    Standing Status:   Future    Standing Expiration  Date:   11/20/2017    Laroy Apple, MD [facility] 11/20/2016, 11:41 AM

## 2017-06-26 ENCOUNTER — Ambulatory Visit: Payer: BLUE CROSS/BLUE SHIELD

## 2017-06-26 ENCOUNTER — Ambulatory Visit (INDEPENDENT_AMBULATORY_CARE_PROVIDER_SITE_OTHER): Payer: BLUE CROSS/BLUE SHIELD | Admitting: *Deleted

## 2017-06-26 DIAGNOSIS — Z23 Encounter for immunization: Secondary | ICD-10-CM

## 2017-06-26 DIAGNOSIS — Z111 Encounter for screening for respiratory tuberculosis: Secondary | ICD-10-CM | POA: Diagnosis not present

## 2017-06-26 NOTE — Progress Notes (Signed)
PPD placed L forearm Pt tolerated well 

## 2017-06-28 LAB — TB SKIN TEST
INDURATION: 0 mm
TB SKIN TEST: NEGATIVE

## 2017-09-21 DIAGNOSIS — H10022 Other mucopurulent conjunctivitis, left eye: Secondary | ICD-10-CM | POA: Diagnosis not present

## 2017-09-21 DIAGNOSIS — J069 Acute upper respiratory infection, unspecified: Secondary | ICD-10-CM | POA: Diagnosis not present

## 2017-09-21 DIAGNOSIS — Z6837 Body mass index (BMI) 37.0-37.9, adult: Secondary | ICD-10-CM | POA: Diagnosis not present

## 2017-10-10 ENCOUNTER — Encounter: Payer: Self-pay | Admitting: Family Medicine

## 2017-10-28 ENCOUNTER — Ambulatory Visit (INDEPENDENT_AMBULATORY_CARE_PROVIDER_SITE_OTHER): Payer: BLUE CROSS/BLUE SHIELD | Admitting: Family Medicine

## 2017-10-28 ENCOUNTER — Encounter: Payer: Self-pay | Admitting: Family Medicine

## 2017-10-28 VITALS — BP 136/85 | HR 95 | Temp 97.6°F | Ht 69.0 in | Wt 262.0 lb

## 2017-10-28 DIAGNOSIS — Z0000 Encounter for general adult medical examination without abnormal findings: Secondary | ICD-10-CM | POA: Diagnosis not present

## 2017-10-28 DIAGNOSIS — K429 Umbilical hernia without obstruction or gangrene: Secondary | ICD-10-CM

## 2017-10-28 DIAGNOSIS — M1990 Unspecified osteoarthritis, unspecified site: Secondary | ICD-10-CM

## 2017-10-28 DIAGNOSIS — I10 Essential (primary) hypertension: Secondary | ICD-10-CM

## 2017-10-28 DIAGNOSIS — E785 Hyperlipidemia, unspecified: Secondary | ICD-10-CM

## 2017-10-28 DIAGNOSIS — R0789 Other chest pain: Secondary | ICD-10-CM

## 2017-10-28 DIAGNOSIS — Z308 Encounter for other contraceptive management: Secondary | ICD-10-CM

## 2017-10-28 NOTE — Progress Notes (Signed)
BP 136/85   Pulse 95   Temp 97.6 F (36.4 C) (Oral)   Ht '5\' 9"'$  (1.753 m)   Wt 262 lb (118.8 kg)   BMI 38.69 kg/m    Subjective:    Patient ID: Larry Paul, male    DOB: 1971/03/10, 47 y.o.   MRN: [id number]  HPI: Larry Paul is a 47 y.o. male presenting on 10/28/2017 for Annual Exam   HPI Adult well exam and physical Patient comes in for adult well exam and physical today.  He says he has some chest discomfort that happens every now and then.  He cannot pinpoint whether it is at rest or with activity but it is in the center of his chest and usually lasts about 10 minutes and then it goes away.  He cannot pinpoint what makes it go away or what makes it better but says he has been experiencing this over the past few months to a year, he cannot recall exactly.  He says he does have arthritis in both of his hands and some in his wrists and has a mother that had rheumatoid arthritis and he would like to get checked to make sure it is not that and just some other type of arthritis.  He is also coming in today for a recheck on cholesterol and hypertension and obesity.  Patient does admit that he has an umbilical hernia as well but denies any constipation or blood in his stool.  He is always able to push it back in but it has been more prominent since he has gained a lot of weight.  Relevant past medical, surgical, family and social history reviewed and updated as indicated. Interim medical history since our last visit reviewed. Allergies and medications reviewed and updated.  Review of Systems  Constitutional: Negative for chills and fever.  HENT: Negative for ear pain and tinnitus.   Eyes: Negative for pain.  Respiratory: Negative for cough, shortness of breath and wheezing.   Cardiovascular: Positive for chest pain. Negative for palpitations and leg swelling.  Gastrointestinal: Negative for abdominal distention, abdominal pain, blood in stool, constipation, diarrhea and nausea.    Genitourinary: Negative for dysuria and hematuria.  Musculoskeletal: Positive for arthralgias. Negative for back pain and myalgias.  Skin: Negative for rash.  Neurological: Negative for dizziness, weakness and headaches.  Psychiatric/Behavioral: Negative for suicidal ideas.    Per HPI unless specifically indicated above   Allergies as of 10/28/2017   No Known Allergies     Medication List        Accurate as of 10/28/17  2:16 PM. Always use your most recent med list.          magnesium 30 MG tablet Take 30 mg by mouth 2 (two) times daily.   Melatonin 3 MG Tabs Take 9 mg by mouth at bedtime.   multivitamin tablet Take 1 tablet by mouth daily.          Objective:    BP 136/85   Pulse 95   Temp 97.6 F (36.4 C) (Oral)   Ht '5\' 9"'$  (1.753 m)   Wt 262 lb (118.8 kg)   BMI 38.69 kg/m   Wt Readings from Last 3 Encounters:  10/28/17 262 lb (118.8 kg)  11/20/16 257 lb 9.6 oz (116.8 kg)  06/25/14 256 lb (116.1 kg)    Physical Exam  Constitutional: He is oriented to person, place, and time. He appears well-developed and well-nourished. No distress.  HENT:  Right Ear: External  ear normal.  Left Ear: External ear normal.  Nose: Nose normal.  Mouth/Throat: Oropharynx is clear and moist. No oropharyngeal exudate.  Eyes: Pupils are equal, round, and reactive to light. Conjunctivae and EOM are normal. Right eye exhibits no discharge. No scleral icterus.  Neck: Neck supple. No thyromegaly present.  Cardiovascular: Normal rate, regular rhythm, normal heart sounds and intact distal pulses.  No murmur heard. Pulmonary/Chest: Effort normal and breath sounds normal. No respiratory distress. He has no wheezes.  Abdominal: Soft. Bowel sounds are normal. He exhibits no distension. There is no tenderness. There is no rebound and no guarding. A hernia is present. Hernia confirmed positive in the ventral area (reproducible).  Musculoskeletal: Normal range of motion. He exhibits no edema.   Lymphadenopathy:    He has no cervical adenopathy.  Neurological: He is alert and oriented to person, place, and time. Coordination normal.  Skin: Skin is warm and dry. No rash noted. He is not diaphoretic.  Psychiatric: He has a normal mood and affect. His behavior is normal.  Vitals reviewed.       Assessment & Plan:   Problem List Items Addressed This Visit      Cardiovascular and Mediastinum   HTN (hypertension)   Relevant Orders   CMP14+EGFR     Other   Morbid obesity ([place])   Dyslipidemia   Relevant Orders   Lipid panel    Other Visit Diagnoses    Well adult exam    -  Primary   Relevant Orders   CBC with Differential/Platelet   CMP14+EGFR   Lipid panel   Bayer DCA Hb A1c Waived   Arthritis       Arthritis in a few different joints and family history of rheumatoid arthritis, wants to be checked for this.   Relevant Orders   CBC with Differential/Platelet   Arthritis Panel   Atypical chest pain       Umbilical hernia without obstruction and without gangrene       Wants to hold off on treatment for now, will monitor   Encounter for male birth control       Relevant Orders   Ambulatory referral to Urology       Follow up plan: Return if symptoms worsen or fail to improve.  Counseling provided for all of the vaccine components No orders of the defined types were placed in this encounter.   Caryl Pina, MD [facility] 10/28/2017, 2:16 PM

## 2017-10-28 NOTE — Addendum Note (Signed)
Addended by: Arville CareETTINGER, Ercel Normoyle on: 10/28/2017 04:46 PM   Modules accepted: Orders

## 2017-11-08 ENCOUNTER — Telehealth: Payer: Self-pay | Admitting: *Deleted

## 2017-11-08 NOTE — Telephone Encounter (Signed)
I tried to call patient to schedule treadmill

## 2017-11-13 NOTE — Telephone Encounter (Signed)
No call back - this encounter will be closed.  

## 2019-04-15 ENCOUNTER — Encounter: Payer: Self-pay | Admitting: Family Medicine

## 2019-04-15 ENCOUNTER — Ambulatory Visit (INDEPENDENT_AMBULATORY_CARE_PROVIDER_SITE_OTHER): Payer: BC Managed Care – PPO | Admitting: Family Medicine

## 2019-04-15 DIAGNOSIS — K429 Umbilical hernia without obstruction or gangrene: Secondary | ICD-10-CM | POA: Diagnosis not present

## 2019-04-15 NOTE — Progress Notes (Signed)
    Subjective:    Patient ID: Larry Paul, male    DOB: [birth year], 48 y.o.   MRN: [id number]   HPI: Larry Paul is a 48 y.o. male presenting for history of umbilical hernia for a few years, getting bigger. Having loop of intestine get into it, pops out,  and it hurts for him to reduce it. About the size of a silver dollar.Feels tight.   Depression screen [facility] 2/9 10/28/2017 11/20/2016  Decreased Interest 0 0  Down, Depressed, Hopeless 0 0  PHQ - 2 Score 0 0     Relevant past medical, surgical, family and social history reviewed and updated as indicated.  Interim medical history since our last visit reviewed. Allergies and medications reviewed and updated.  ROS:  Review of Systems  Constitutional: Negative for fever.  Gastrointestinal: Positive for abdominal pain. Negative for constipation, diarrhea, nausea and rectal pain.     Social History   Tobacco Use  Smoking Status Never Smoker  Smokeless Tobacco Never Used       Objective:     Wt Readings from Last 3 Encounters:  10/28/17 262 lb (118.8 kg)  11/20/16 257 lb 9.6 oz (116.8 kg)  06/25/14 256 lb (116.1 kg)     Exam deferred. Pt. Harboring due to COVID 19. Phone visit performed.   Assessment & Plan:   1. Reducible umbilical hernia     No orders of the defined types were placed in this encounter.   Orders Placed This Encounter  Procedures  . General Surgery    Referral Priority:   Routine    Referral Type:   Surgical    Referral Reason:   Specialty Services Required    Requested Specialty:   General Surgery    Number of Visits Requested:   1      Diagnoses and all orders for this visit:  Reducible umbilical hernia -     General Surgery    Virtual Visit via telephone Note  I discussed the limitations, risks, security and privacy concerns of performing an evaluation and management service by telephone and the availability of in person appointments. The patient was identified with two  identifiers. Pt.expressed understanding and agreed to proceed. Pt. Is at home. Dr. Livia Snellen is in his office.  Follow Up Instructions:   I discussed the assessment and treatment plan with the patient. The patient was provided an opportunity to ask questions and all were answered. The patient agreed with the plan and demonstrated an understanding of the instructions.   The patient was advised to call back or seek an in-person evaluation if the symptoms worsen or if the condition fails to improve as anticipated.   Total minutes including chart review and phone contact time: 7   Follow up plan: Return if symptoms worsen or fail to improve.  Claretta Fraise, MD [place]

## 2019-05-12 ENCOUNTER — Ambulatory Visit: Payer: Self-pay | Admitting: Surgery

## 2019-05-12 DIAGNOSIS — K429 Umbilical hernia without obstruction or gangrene: Secondary | ICD-10-CM | POA: Diagnosis not present

## 2019-05-12 NOTE — H&P (Signed)
History of Present Illness Larry Paul. Larry Brand MD; 05/12/2019 5:32 PM) The patient is a 49 year old male who presents with an umbilical hernia. Referred by Dr. Claretta Fraise for enlarging umbilical hernia  This is a 49 year old male with obesity but otherwise relatively good health who presents with approximately 5 years of an umbilical hernia. This has become fairly large over the last several months. He has begun to notice more discomfort when he tries to reduce the hernia. He denies any obstructive symptoms. No infection or drainage in this area. No previous abdominal surgery.   Problem List/Past Medical Larry Key K. Tennille Montelongo, MD; 9/48/5462 7:03 PM) UMBILICAL HERNIA WITHOUT OBSTRUCTION OR GANGRENE (K42.9)  Past Surgical History Emeline Gins, [place]; 05/12/2019 2:51 PM) No pertinent past surgical history  Allergies Emeline Gins, CMA; 05/12/2019 2:52 PM) No Known Drug Allergies [05/12/2019]: Allergies Reconciled  Medication History Emeline Gins, [place]; 05/12/2019 2:52 PM) Medications Reconciled  Social History Emeline Gins, [place]; 05/12/2019 2:51 PM) Caffeine use Carbonated beverages, Coffee, Tea. No alcohol use No drug use Tobacco use Never smoker.  Family History Emeline Gins, [place]; 05/12/2019 2:51 PM) Alcohol Abuse Father. Arthritis Mother. Cerebrovascular Accident Mother. Heart Disease Sister. Heart disease in male family member before age 58  Other Problems Larry Paul. Caniyah Murley, MD; 05/12/2019 5:32 PM) Heart murmur Hemorrhoids Umbilical Hernia Repair     Review of Systems Emeline Gins CMA; 05/12/2019 2:51 PM) General Not Present- Appetite Loss, Chills, Fatigue, Fever, Night Sweats, Weight Gain and Weight Loss. Skin Not Present- Change in Wart/Mole, Dryness, Hives, Jaundice, New Lesions, Non-Healing Wounds, Rash and Ulcer. HEENT Present- Ringing in the Ears, Seasonal Allergies and Wears glasses/contact lenses. Not Present- Earache, Hearing Loss,  Hoarseness, Nose Bleed, Oral Ulcers, Sinus Pain, Sore Throat, Visual Disturbances and Yellow Eyes. Respiratory Not Present- Bloody sputum, Chronic Cough, Difficulty Breathing, Snoring and Wheezing. Breast Not Present- Breast Mass, Breast Pain, Nipple Discharge and Skin Changes. Gastrointestinal Present- Abdominal Pain. Not Present- Bloating, Bloody Stool, Change in Bowel Habits, Chronic diarrhea, Constipation, Difficulty Swallowing, Excessive gas, Gets full quickly at meals, Hemorrhoids, Indigestion, Nausea, Rectal Pain and Vomiting. Male Genitourinary Not Present- Blood in Urine, Change in Urinary Stream, Frequency, Impotence, Nocturia, Painful Urination, Urgency and Urine Leakage. Musculoskeletal Present- Joint Pain. Not Present- Back Pain, Joint Stiffness, Muscle Pain, Muscle Weakness and Swelling of Extremities. Neurological Not Present- Decreased Memory, Fainting, Headaches, Numbness, Seizures, Tingling, Tremor, Trouble walking and Weakness. Psychiatric Not Present- Anxiety, Bipolar, Change in Sleep Pattern, Depression, Fearful and Frequent crying. Endocrine Not Present- Cold Intolerance, Excessive Hunger, Hair Changes, Heat Intolerance, Hot flashes and New Diabetes. Hematology Not Present- Blood Thinners, Easy Bruising, Excessive bleeding, Gland problems, HIV and Persistent Infections.  Vitals Emeline Gins CMA; 05/12/2019 2:52 PM) 05/12/2019 2:52 PM Weight: 261.6 lb Height: 69in Body Surface Area: 2.32 m Body Mass Index: 38.63 kg/m  Temp.: 66F  Pulse: 103 (Regular)  BP: 130/84 (Sitting, Left Arm, Standard)        Physical Exam Larry Key K. Hensley Aziz MD; 05/12/2019 5:33 PM)  The physical exam findings are as follows: Note:WDWN in NAD Eyes: Pupils equal, round; sclera anicteric HENT: Oral mucosa moist; good dentition Neck: No masses palpated, no thyromegaly Lungs: CTA bilaterally; normal respiratory effort CV: Regular rate and rhythm; no murmurs; extremities  well-perfused with no edema Abd: +bowel sounds, soft, non-tender, no palpable organomegaly; protruding umbilical hernia - hernia sac about 5 cm in diameter With the patient supine and relaxed, the hernia slowly spontaneously reduces. I can palpate a 2 cm hernia defect in  the umbilical fascia. There is no discoloration or thickening of the overlying skin. No drainage noted. Skin: Warm, dry; no sign of jaundice Psychiatric - alert and oriented x 4; calm mood and affect    Assessment & Plan Molli Hazard K. Kiele Heavrin MD; 05/12/2019 3:17 PM)  UMBILICAL HERNIA WITHOUT OBSTRUCTION OR GANGRENE (K42.9)  Current Plans Schedule for Surgery - Umbilical hernia repair with mesh. The surgical procedure has been discussed with the patient. Potential risks, benefits, alternative treatments, and expected outcomes have been explained. All of the patient's questions at this time have been answered. The likelihood of reaching the patient's treatment goal is good. The patient understand the proposed surgical procedure and wishes to proceed.  Wilmon Arms. Corliss Skains, MD, [facility]  General/ Trauma Surgery   05/12/2019 5:33 PM

## 2019-06-09 ENCOUNTER — Encounter (HOSPITAL_BASED_OUTPATIENT_CLINIC_OR_DEPARTMENT_OTHER): Payer: Self-pay | Admitting: Surgery

## 2019-06-09 ENCOUNTER — Other Ambulatory Visit: Payer: Self-pay

## 2019-06-13 ENCOUNTER — Other Ambulatory Visit (HOSPITAL_COMMUNITY)
Admission: RE | Admit: 2019-06-13 | Discharge: 2019-06-13 | Disposition: A | Discharge status: Home / Self Care | Payer: BC Managed Care – PPO | Source: Ambulatory Visit | Attending: Surgery | Admitting: Surgery

## 2019-06-13 DIAGNOSIS — Z01812 Encounter for preprocedural laboratory examination: Secondary | ICD-10-CM | POA: Insufficient documentation

## 2019-06-13 DIAGNOSIS — Z20822 Contact with and (suspected) exposure to covid-19: Secondary | ICD-10-CM | POA: Diagnosis not present

## 2019-06-13 LAB — SARS CORONAVIRUS 2 (TAT 6-24 HRS): SARS Coronavirus 2: NEGATIVE

## 2019-06-17 ENCOUNTER — Encounter (HOSPITAL_BASED_OUTPATIENT_CLINIC_OR_DEPARTMENT_OTHER): Payer: Self-pay | Admitting: Surgery

## 2019-06-17 ENCOUNTER — Encounter (HOSPITAL_BASED_OUTPATIENT_CLINIC_OR_DEPARTMENT_OTHER)
Admission: RE | Disposition: A | Discharge status: Home / Self Care | Payer: Self-pay | Source: Home / Self Care | Attending: Surgery

## 2019-06-17 ENCOUNTER — Ambulatory Visit (HOSPITAL_BASED_OUTPATIENT_CLINIC_OR_DEPARTMENT_OTHER)
Admission: RE | Admit: 2019-06-17 | Discharge: 2019-06-17 | Disposition: A | Discharge status: Home / Self Care | Payer: BC Managed Care – PPO | Attending: Surgery | Admitting: Surgery

## 2019-06-17 ENCOUNTER — Ambulatory Visit (HOSPITAL_BASED_OUTPATIENT_CLINIC_OR_DEPARTMENT_OTHER): Payer: BC Managed Care – PPO | Admitting: Anesthesiology

## 2019-06-17 ENCOUNTER — Other Ambulatory Visit: Payer: Self-pay

## 2019-06-17 DIAGNOSIS — I10 Essential (primary) hypertension: Secondary | ICD-10-CM | POA: Diagnosis not present

## 2019-06-17 DIAGNOSIS — E669 Obesity, unspecified: Secondary | ICD-10-CM | POA: Diagnosis not present

## 2019-06-17 DIAGNOSIS — G4730 Sleep apnea, unspecified: Secondary | ICD-10-CM | POA: Insufficient documentation

## 2019-06-17 DIAGNOSIS — K429 Umbilical hernia without obstruction or gangrene: Secondary | ICD-10-CM | POA: Insufficient documentation

## 2019-06-17 DIAGNOSIS — Z6836 Body mass index (BMI) 36.0-36.9, adult: Secondary | ICD-10-CM | POA: Diagnosis not present

## 2019-06-17 DIAGNOSIS — Z8249 Family history of ischemic heart disease and other diseases of the circulatory system: Secondary | ICD-10-CM | POA: Insufficient documentation

## 2019-06-17 DIAGNOSIS — G4733 Obstructive sleep apnea (adult) (pediatric): Secondary | ICD-10-CM | POA: Diagnosis not present

## 2019-06-17 HISTORY — PX: UMBILICAL HERNIA REPAIR: SHX196

## 2019-06-17 HISTORY — PX: INSERTION OF MESH: SHX5868

## 2019-06-17 HISTORY — DX: Sleep apnea, unspecified: G47.30

## 2019-06-17 SURGERY — REPAIR, HERNIA, UMBILICAL, ADULT
Anesthesia: General | Site: Abdomen

## 2019-06-17 MED ORDER — GABAPENTIN 300 MG PO CAPS
ORAL_CAPSULE | ORAL | Status: AC
  Filled 2019-06-17: qty 1

## 2019-06-17 MED ORDER — SUCCINYLCHOLINE CHLORIDE 200 MG/10ML IV SOSY
PREFILLED_SYRINGE | INTRAVENOUS | Status: DC | PRN
  Administered 2019-06-17: 200 mg via INTRAVENOUS

## 2019-06-17 MED ORDER — FENTANYL CITRATE (PF) 100 MCG/2ML IJ SOLN
25.0000 ug | INTRAMUSCULAR | Status: DC | PRN
  Administered 2019-06-17: 50 ug via INTRAVENOUS

## 2019-06-17 MED ORDER — CEFAZOLIN SODIUM-DEXTROSE 1-4 GM/50ML-% IV SOLN
INTRAVENOUS | Status: AC
  Filled 2019-06-17: qty 50

## 2019-06-17 MED ORDER — GABAPENTIN 300 MG PO CAPS
300.0000 mg | ORAL_CAPSULE | ORAL | Status: AC
  Administered 2019-06-17: 11:00:00 300 mg via ORAL

## 2019-06-17 MED ORDER — FENTANYL CITRATE (PF) 100 MCG/2ML IJ SOLN
INTRAMUSCULAR | Status: AC
  Filled 2019-06-17: qty 2

## 2019-06-17 MED ORDER — BUPIVACAINE HCL 0.25 % IJ SOLN
INTRAMUSCULAR | Status: DC | PRN
  Administered 2019-06-17: 10 mL

## 2019-06-17 MED ORDER — LIDOCAINE 2% (20 MG/ML) 5 ML SYRINGE: INTRAMUSCULAR | Status: DC | PRN

## 2019-06-17 MED ORDER — DEXAMETHASONE SODIUM PHOSPHATE 10 MG/ML IJ SOLN
4.0000 mg | INTRAMUSCULAR | Status: AC
  Administered 2019-06-17: 4 mg via INTRAVENOUS

## 2019-06-17 MED ORDER — LACTATED RINGERS IV SOLN: INTRAVENOUS | Status: DC

## 2019-06-17 MED ORDER — ACETAMINOPHEN 500 MG PO TABS
ORAL_TABLET | ORAL | Status: AC
  Filled 2019-06-17: qty 2

## 2019-06-17 MED ORDER — CHLORHEXIDINE GLUCONATE CLOTH 2 % EX PADS: 6.0000 | MEDICATED_PAD | Freq: Once | CUTANEOUS | Status: DC

## 2019-06-17 MED ORDER — DEXTROSE 5 % IV SOLN
3.0000 g | INTRAVENOUS | Status: AC
  Administered 2019-06-17: 3 g via INTRAVENOUS

## 2019-06-17 MED ORDER — BUPIVACAINE HCL (PF) 0.25 % IJ SOLN
INTRAMUSCULAR | Status: AC
  Filled 2019-06-17: qty 30

## 2019-06-17 MED ORDER — ACETAMINOPHEN 500 MG PO TABS
1000.0000 mg | ORAL_TABLET | ORAL | Status: AC
  Administered 2019-06-17: 1000 mg via ORAL

## 2019-06-17 MED ORDER — MIDAZOLAM HCL 2 MG/2ML IJ SOLN
INTRAMUSCULAR | Status: AC
  Filled 2019-06-17: qty 2

## 2019-06-17 MED ORDER — OXYCODONE HCL 5 MG PO TABS: 5.0000 mg | ORAL_TABLET | Freq: Four times a day (QID) | ORAL | 0 refills | Status: DC | PRN

## 2019-06-17 MED ORDER — PROPOFOL 10 MG/ML IV BOLUS
INTRAVENOUS | Status: DC | PRN
  Administered 2019-06-17: 200 mg via INTRAVENOUS

## 2019-06-17 MED ORDER — MIDAZOLAM HCL 5 MG/5ML IJ SOLN
INTRAMUSCULAR | Status: DC | PRN
  Administered 2019-06-17: 2 mg via INTRAVENOUS

## 2019-06-17 MED ORDER — LIDOCAINE 2% (20 MG/ML) 5 ML SYRINGE
INTRAMUSCULAR | Status: AC
  Filled 2019-06-17: qty 5

## 2019-06-17 MED ORDER — ROCURONIUM BROMIDE 10 MG/ML (PF) SYRINGE
PREFILLED_SYRINGE | INTRAVENOUS | Status: AC
  Filled 2019-06-17: qty 10

## 2019-06-17 MED ORDER — ONDANSETRON HCL 4 MG/2ML IJ SOLN
INTRAMUSCULAR | Status: DC | PRN
  Administered 2019-06-17: 4 mg via INTRAVENOUS

## 2019-06-17 MED ORDER — CEFAZOLIN SODIUM-DEXTROSE 2-4 GM/100ML-% IV SOLN
INTRAVENOUS | Status: AC
  Filled 2019-06-17: qty 100

## 2019-06-17 MED ORDER — FENTANYL CITRATE (PF) 250 MCG/5ML IJ SOLN
INTRAMUSCULAR | Status: DC | PRN
  Administered 2019-06-17 (×2): 50 ug via INTRAVENOUS

## 2019-06-17 MED ORDER — LIDOCAINE 2% (20 MG/ML) 5 ML SYRINGE
INTRAMUSCULAR | Status: DC | PRN
  Administered 2019-06-17: 100 mg via INTRAVENOUS

## 2019-06-17 MED ORDER — KETOROLAC TROMETHAMINE 30 MG/ML IJ SOLN
INTRAMUSCULAR | Status: AC
  Filled 2019-06-17: qty 1

## 2019-06-17 MED ORDER — DEXAMETHASONE SODIUM PHOSPHATE 10 MG/ML IJ SOLN
INTRAMUSCULAR | Status: AC
  Filled 2019-06-17: qty 1

## 2019-06-17 MED ORDER — PROPOFOL 10 MG/ML IV BOLUS
INTRAVENOUS | Status: AC
  Filled 2019-06-17: qty 40

## 2019-06-17 MED ORDER — ENSURE PRE-SURGERY PO LIQD: 296.0000 mL | Freq: Once | ORAL | Status: DC

## 2019-06-17 SURGICAL SUPPLY — 39 items
BENZOIN TINCTURE PRP APPL 2/3 (GAUZE/BANDAGES/DRESSINGS) ×4 IMPLANT
BLADE CLIPPER SURG (BLADE) ×4 IMPLANT
BLADE HEX COATED 2.75 (ELECTRODE) ×4 IMPLANT
BLADE SURG 15 STRL LF DISP TIS (BLADE) ×2 IMPLANT
BLADE SURG 15 STRL SS (BLADE) ×2
CHLORAPREP W/TINT 26 (MISCELLANEOUS) ×4 IMPLANT
CLOSURE WOUND 1/2 X4 (GAUZE/BANDAGES/DRESSINGS) ×1
COVER BACK TABLE 60X90IN (DRAPES) ×4 IMPLANT
COVER MAYO STAND STRL (DRAPES) ×4 IMPLANT
DRAPE LAPAROTOMY 100X72 PEDS (DRAPES) ×4 IMPLANT
DRAPE UTILITY XL STRL (DRAPES) ×4 IMPLANT
DRSG TEGADERM 4X4.75 (GAUZE/BANDAGES/DRESSINGS) ×4 IMPLANT
ELECT REM PT RETURN 9FT ADLT (ELECTROSURGICAL) ×4
ELECTRODE REM PT RTRN 9FT ADLT (ELECTROSURGICAL) ×2 IMPLANT
GAUZE SPONGE 4X4 12PLY STRL LF (GAUZE/BANDAGES/DRESSINGS) IMPLANT
GLOVE BIO SURGEON STRL SZ7 (GLOVE) ×8 IMPLANT
GLOVE BIOGEL PI IND STRL 7.5 (GLOVE) ×4 IMPLANT
GLOVE BIOGEL PI INDICATOR 7.5 (GLOVE) ×4
GOWN STRL REUS W/ TWL LRG LVL3 (GOWN DISPOSABLE) ×4 IMPLANT
GOWN STRL REUS W/TWL LRG LVL3 (GOWN DISPOSABLE) ×4
MESH HERNIA 3X6 (Mesh General) ×4 IMPLANT
NEEDLE HYPO 25X1 1.5 SAFETY (NEEDLE) ×4 IMPLANT
NS IRRIG 1000ML POUR BTL (IV SOLUTION) ×4 IMPLANT
PACK BASIN DAY SURGERY FS (CUSTOM PROCEDURE TRAY) ×4 IMPLANT
PENCIL SMOKE EVACUATOR (MISCELLANEOUS) ×4 IMPLANT
SLEEVE SCD COMPRESS KNEE MED (MISCELLANEOUS) ×4 IMPLANT
SPONGE GAUZE 2X2 8PLY STER LF (GAUZE/BANDAGES/DRESSINGS) ×1
SPONGE GAUZE 2X2 8PLY STRL LF (GAUZE/BANDAGES/DRESSINGS) ×3 IMPLANT
STRIP CLOSURE SKIN 1/2X4 (GAUZE/BANDAGES/DRESSINGS) ×3 IMPLANT
SUT MNCRL AB 4-0 PS2 18 (SUTURE) ×4 IMPLANT
SUT NOVA 0 T19/GS 22DT (SUTURE) ×4 IMPLANT
SUT NOVA NAB DX-16 0-1 5-0 T12 (SUTURE) ×4 IMPLANT
SUT VIC AB 3-0 SH 27 (SUTURE) ×2
SUT VIC AB 3-0 SH 27X BRD (SUTURE) ×2 IMPLANT
SYR CONTROL 10ML LL (SYRINGE) ×4 IMPLANT
TOWEL GREEN STERILE FF (TOWEL DISPOSABLE) ×4 IMPLANT
TUBE CONNECTING 20'X1/4 (TUBING) ×1
TUBE CONNECTING 20X1/4 (TUBING) ×3 IMPLANT
YANKAUER SUCT BULB TIP NO VENT (SUCTIONS) ×4 IMPLANT

## 2019-06-17 NOTE — Transfer of Care (Signed)
Immediate Anesthesia Transfer of Care Note  Patient: Larry Paul  Procedure(s) Performed: UMBILICAL HERNIA REPAIR (N/A Abdomen) INSERTION OF MESH (Abdomen)  Patient Location: PACU  Anesthesia Type:General  Level of Consciousness: awake, alert , oriented and patient cooperative  Airway & Oxygen Therapy: Patient Spontanous Breathing and Patient connected to face mask oxygen  Post-op Assessment: Report given to RN, Post -op Vital signs reviewed and stable and Patient moving all extremities  Post vital signs: Reviewed and stable  Last Vitals:  Vitals Value Taken Time  BP 130/86 06/17/19 1252  Temp    Pulse 74 06/17/19 1254  Resp 16 06/17/19 1254  SpO2 100 % 06/17/19 1254  Vitals shown include unvalidated device data.  Last Pain:  Vitals:   06/17/19 1049  TempSrc: Oral  PainSc:       Patients Stated Pain Goal: 2 (06/17/19 1029)  Complications: No apparent anesthesia complications

## 2019-06-17 NOTE — Anesthesia Preprocedure Evaluation (Addendum)
Anesthesia Evaluation  Patient identified by MRN, date of birth, ID band Patient awake    Reviewed: Allergy & Precautions, NPO status , Patient's Chart, lab work & pertinent test results  Airway Mallampati: I  TM Distance: >3 FB Neck ROM: Full    Dental no notable dental hx. (+) Chipped, Dental Advisory Given,    Pulmonary sleep apnea and Continuous Positive Airway Pressure Ventilation ,    Pulmonary exam normal breath sounds clear to auscultation       Cardiovascular hypertension, Normal cardiovascular exam Rhythm:Regular Rate:Normal  HLD   Neuro/Psych negative neurological ROS  negative psych ROS   GI/Hepatic negative GI ROS, Neg liver ROS,   Endo/Other  Obese BMI 36  Renal/GU negative Renal ROS  negative genitourinary   Musculoskeletal negative musculoskeletal ROS (+)   Abdominal   Peds  Hematology negative hematology ROS (+)   Anesthesia Other Findings   Reproductive/Obstetrics                           Anesthesia Physical Anesthesia Plan  ASA: III  Anesthesia Plan: General   Post-op Pain Management:    Induction: Intravenous  PONV Risk Score and Plan: 2 and Midazolam, Dexamethasone and Ondansetron  Airway Management Planned: Oral ETT  Additional Equipment:   Intra-op Plan:   Post-operative Plan: Extubation in OR  Informed Consent: I have reviewed the patients History and Physical, chart, labs and discussed the procedure including the risks, benefits and alternatives for the proposed anesthesia with the patient or authorized representative who has indicated his/her understanding and acceptance.     Dental advisory given  Plan Discussed with: CRNA  Anesthesia Plan Comments:         Anesthesia Quick Evaluation

## 2019-06-17 NOTE — Op Note (Signed)
Indications:  This is a 49 year old male with obesity but otherwise relatively good health who presents with approximately 5 years of an umbilical hernia. This has become fairly large over the last several months. He has begun to notice more discomfort when he tries to reduce the hernia. He denies any obstructive symptoms. No infection or drainage in this area. No previous abdominal surgery.  The patient was examined and we recommended umbilical hernia repair with mesh.  Pre-operative diagnosis:  Umbilical hernia  Post-operative diagnosis:  Same  Procedure:  Umbilical hernia repair with mesh  Procedure Details  The patient was seen again in the Holding Room. The risks, benefits, complications, treatment options, and expected outcomes were discussed with the patient. The possibilities of reaction to medication, pulmonary aspiration, perforation of viscus, bleeding, recurrent infection, the need for additional procedures, and development of a complication requiring transfusion or further operation were discussed with the patient and/or family. There was concurrence with the proposed plan, and informed consent was obtained. The site of surgery was properly noted/marked. The patient was taken to the Operating Room, identified as Larry Paul, and the procedure verified as umbilical hernia repair. A Time Out was held and the above information confirmed.  After an adequate level of general anesthesia was obtained, the patient's abdomen was prepped with Chloraprep and draped in sterile fashion.  We made a transverse incision below the umbilicus.  Dissection was carried down to the hernia sac with cautery.  We dissected bluntly around the hernia sac down to the edge of the fascial defect.  We reduced the hernia sac back into the pre-peritoneal space.  The fascial defect measured 2.5 cm in diameter.  We cleared the fascia in all directions.  A medium Ventralex mesh was inserted into the pre-peritoneal space  and was deployed.  The mesh was secured with four trans-fascial sutures of 0 Novofil.  The fascial defect was closed with multiple interrupted figure-of-eight 1 Novofil sutures.  The base of the umbilicus was tacked down with 3-0 Vicryl.  3-0 Vicryl was used to close the subcutaneous tissues and 4-0 Monocryl was used to close the skin.  Steri-strips and clean dressing were applied.  The patient was extubated and brought to the recovery room in stable condition.  All sponge, instrument, and needle counts were correct prior to closure and at the conclusion of the case.   Estimated Blood Loss: Minimal          Complications: None; patient tolerated the procedure well.         Disposition: PACU - hemodynamically stable.         Condition: stable  Wilmon Arms. Corliss Skains, MD, [facility]  General/ Trauma Surgery   06/17/2019 12:50 PM

## 2019-06-17 NOTE — Anesthesia Postprocedure Evaluation (Signed)
Anesthesia Post Note  Patient: Larry Paul  Procedure(s) Performed: UMBILICAL HERNIA REPAIR (N/A Abdomen) INSERTION OF MESH (Abdomen)     Patient location during evaluation: PACU Anesthesia Type: General Level of consciousness: awake and alert Pain management: pain level controlled Vital Signs Assessment: post-procedure vital signs reviewed and stable Respiratory status: spontaneous breathing, nonlabored ventilation, respiratory function stable and patient connected to nasal cannula oxygen Cardiovascular status: blood pressure returned to baseline and stable Postop Assessment: no apparent nausea or vomiting Anesthetic complications: no    Last Vitals:  Vitals:   06/17/19 1326 06/17/19 1330  BP:  (!) 133/92  Pulse: 67   Resp: 14   Temp:    SpO2: 95%     Last Pain:  Vitals:   06/17/19 1411  TempSrc:   PainSc: 4                  Renad Jenniges L Corinthian Kemler

## 2019-06-17 NOTE — Discharge Instructions (Signed)
[facility], [place]  UMBILICAL HERNIA REPAIR: POST OP INSTRUCTIONS  Always review your discharge instruction sheet given to you by the facility where your surgery was performed. IF YOU HAVE DISABILITY OR FAMILY LEAVE FORMS, YOU MUST BRING THEM TO THE OFFICE FOR PROCESSING.   DO NOT GIVE THEM TO YOUR DOCTOR.  1. A  prescription for pain medication may be given to you upon discharge.  Take your pain medication as prescribed, if needed.  If narcotic pain medicine is not needed, then you may take acetaminophen (Tylenol) or ibuprofen (Advil) as needed. 2. Take your usually prescribed medications unless otherwise directed. 3. If you need a refill on your pain medication, please contact your pharmacy.  They will contact our office to request authorization. Prescriptions will not be filled after 5 pm or on week-ends. 4. You should follow a light diet the first 24 hours after arrival home, such as soup and crackers, etc.  Be sure to include lots of fluids daily.  Resume your normal diet the day after surgery. 5. Most patients will experience some swelling and bruising around the umbilicus or in the groin and scrotum.  Ice packs and reclining will help.  Swelling and bruising can take several days to resolve.  6. It is common to experience some constipation if taking pain medication after surgery.  Increasing fluid intake and taking a stool softener (such as Colace) will usually help or prevent this problem from occurring.  A mild laxative (Milk of Magnesia or Miralax) should be taken according to package directions if there are no bowel movements after 48 hours. 7. Unless discharge instructions indicate otherwise, you may remove your bandages 24-48 hours after surgery, and you may shower at that time.  You will have steri-strips (small skin tapes) in place directly over the incision.  These strips should be left on the skin for 7-10 days. 8. ACTIVITIES:  You may resume regular (light) daily activities  beginning the next day--such as daily self-care, walking, climbing stairs--gradually increasing activities as tolerated.  You may have sexual intercourse when it is comfortable.  Refrain from any heavy lifting or straining until approved by your doctor. a. You may drive when you are no longer taking prescription pain medication, you can comfortably wear a seatbelt, and you can safely maneuver your car and apply brakes. b. RETURN TO WORK:  2-3 weeks with light duty - no lifting over 15 lbs. 9. You should see your doctor in the office for a follow-up appointment approximately 2-3 weeks after your surgery.  Make sure that you call for this appointment within a day or two after you arrive home to insure a convenient appointment time. 10. OTHER INSTRUCTIONS:  __________________________________________________________________________________________________________________________________________________________________________________________  WHEN TO CALL YOUR DOCTOR: 1. Fever over 101.0 2. Inability to urinate 3. Nausea and/or vomiting 4. Extreme swelling or bruising 5. Continued bleeding from incision. 6. Increased pain, redness, or drainage from the incision  The clinic staff is available to answer your questions during regular business hours.  Please dont hesitate to call and ask to speak to one of the nurses for clinical concerns.  If you have a medical emergency, go to the nearest emergency room or call 911.  A surgeon from [facility] is always on call at the hospital   [address], [address], [place], [place]  [address] ?  [address] [address], [place], [place]   [address] [phone]    FAX [phone] Web site: [email or web address]  NO TYLENOL PRODUCTS UNTIL 4:30 PM    Post Anesthesia Home Care Instructions  Activity: Get plenty of rest for the remainder of the day. A responsible individual must stay with you for 24 hours following the  procedure.  For the next 24 hours, DO NOT: -Drive a car -Paediatric nurse -Drink alcoholic beverages -Take any medication unless instructed by your physician -Make any legal decisions or sign important papers.  Meals: Start with liquid foods such as gelatin or soup. Progress to regular foods as tolerated. Avoid greasy, spicy, heavy foods. If nausea and/or vomiting occur, drink only clear liquids until the nausea and/or vomiting subsides. Call your physician if vomiting continues.  Special Instructions/Symptoms: Your throat may feel dry or sore from the anesthesia or the breathing tube placed in your throat during surgery. If this causes discomfort, gargle with warm salt water. The discomfort should disappear within 24 hours.  If you had a scopolamine patch placed behind your ear for the management of post- operative nausea and/or vomiting:  1. The medication in the patch is effective for 72 hours, after which it should be removed.  Wrap patch in a tissue and discard in the trash. Wash hands thoroughly with soap and water. 2. You may remove the patch earlier than 72 hours if you experience unpleasant side effects which may include dry mouth, dizziness or visual disturbances. 3. Avoid touching the patch. Wash your hands with soap and water after contact with the patch.       Safe Surgery and Sleep Apnea Sleep apnea is a condition in which breathing pauses or becomes shallow during sleep. Most people with the condition are not aware that they have it. It is important for your health care providers to know whether or not you have sleep apnea, especially if you are having surgery. Sleep apnea can increase your risk of complications during and after surgery. What is sleep apnea screening? Sleep apnea screening is a test to determine if you are at risk for sleep apnea. Before you have surgery, get screened for sleep apnea and talk with your surgeon and primary health care provider about your  results. Screening usually involves answering a list of questions about your sleep quality. Ask your health care provider if you can be screened, or take a screening test yourself. You can find these tests online at the American Sleep Apnea Association website. Some questions you may be asked include:  Do you snore?  Is your sleep restless?  Do you have daytime sleepiness?  Has a partner or spouse told you that you stop breathing during sleep?  Have you had trouble concentrating or memory loss? Answer these questions honestly. If a screening test is positive, this means you are at risk for the condition. Further testing may be needed to confirm a diagnosis of sleep apnea. Why does sleep apnea increase the risk for complications? Untreated sleep apnea increases the risk for certain complications during and after surgery. This is because when you have sleep apnea, your airways are more sensitive to medicines used during surgery. The airways can collapse and block the flow of air.  Having untreated sleep apnea can increase your risk for:  A longer stay in the recovery room or hospital.  Breathing difficulties such as low oxygen levels after surgery.  Increased pain after surgery.  Irregular heart rhythms.  Stroke.  Heart attack. You and your health care provider can take steps to help prevent these and other complications. What should I do if  I have sleep apnea?  Before surgery  Tell your health care provider and anesthesia specialist that you have sleep apnea. Discuss your individual risks based on your screening results, the type of surgery you will be having, and other medical conditions that you have.  If you have a sleep apnea device (positive airway pressure device), wear it as prescribed. If you have not been wearing your device, talk with your health care provider about why you have not been wearing it. There are ways to improve your use of the device, such as: ? Adjusting the  mask. ? Adding humidified air. ? Getting treatment for nasal congestion.  Do not use any products that contain nicotine or tobacco, such as cigarettes and e-cigarettes. If you need help quitting, ask your health care provider. On the day of surgery  If instructed by your health care provider, bring your sleep apnea device with you.  Wear your sleep apnea device when you are sleeping during your hospital stay, or as told by your health care provider.  Ask your health care provider what special considerations will be taken during and after your surgery. After surgery  You may need to be given extra oxygen and wear a continuous oxygen monitor (pulse oximetry).  For your safety, you may need to stay in the recovery room or hospital for longer than is normal.  Follow instructions from your health care provider about wearing your sleep device: ? Anytime you are sleeping, including during daytime naps. ? While taking prescription pain medicines, sleeping medicines, or medicines that make you drowsy.  If your health care provider approves, raise the head of your bed or lie on your side. Do not lie flat on your back.  Follow instructions from your health care provider about medicines: ? Avoid using sleep medicines unless they are prescribed by a health care provider who is aware of the results of your sleep apnea screening. ? Avoid using sleep medicines while taking opioid pain medicine. ? Limit your use of opioid pain medicines as much as possible. Ask your health care provider what is a safe amount to use. ? Ask about using pain medicines that do not affect your breathing, such as NSAIDs or acetaminophen. Where to find more information For more information about sleep apnea screening and healthy sleep, visit these websites:  Centers for Disease Control and Prevention: DetailSports.is  American Sleep Apnea Association: www.sleepapnea.org Contact a health care provider  if:  You have sleep apnea or think you may be at risk for sleep apnea, and you are scheduled for surgery. Get help right away if:  You have trouble breathing.  You are very drowsy and cannot stay awake.  You are told that you have pauses in your breathing during sleep after surgery.  You have chest pain.  You have a fast heartbeat. Summary  It is important for your health care providers to know whether or not you have sleep apnea, especially if you are having surgery.  If you have sleep apnea, you are at an increased risk for complications during surgery.  You and your health care provider can take precautions to help prevent complications. If you have sleep apnea, make sure to tell your health care provider and anesthesia specialist. This information is not intended to replace advice given to you by your health care provider. Make sure you discuss any questions you have with your health care provider. Document Revised: 08/08/2018 Document Reviewed: 08/02/2016 Elsevier Patient Education  2020 [employer].

## 2019-06-17 NOTE — Anesthesia Procedure Notes (Signed)
Procedure Name: Intubation Date/Time: 06/17/2019 11:58 AM Performed by: Myna Bright, CRNA Pre-anesthesia Checklist: Patient identified, Emergency Drugs available, Suction available and Patient being monitored Patient Re-evaluated:Patient Re-evaluated prior to induction Oxygen Delivery Method: Circle system utilized Preoxygenation: Pre-oxygenation with 100% oxygen Induction Type: IV induction Ventilation: Mask ventilation without difficulty Laryngoscope Size: Mac and 4 Grade View: Grade II Tube type: Oral Tube size: 8.0 mm Number of attempts: 1 Airway Equipment and Method: Stylet Placement Confirmation: positive ETCO2,  breath sounds checked- equal and bilateral and ETT inserted through vocal cords under direct vision Secured at: 22 cm Tube secured with: Tape Dental Injury: Teeth and Oropharynx as per pre-operative assessment

## 2019-06-17 NOTE — H&P (Signed)
History of Present Illness  The patient is a 49 year old male who presents with an umbilical hernia. Referred by Dr. Claretta Fraise for enlarging umbilical hernia  This is a 49 year old male with obesity but otherwise relatively good health who presents with approximately 5 years of an umbilical hernia. This has become fairly large over the last several months. He has begun to notice more discomfort when he tries to reduce the hernia. He denies any obstructive symptoms. No infection or drainage in this area. No previous abdominal surgery.   Problem List/Past Medical  UMBILICAL HERNIA WITHOUT OBSTRUCTION OR GANGRENE (K42.9)  Past Surgical History  No pertinent past surgical history  Allergies Emeline Gins, CMA;  No Known Drug Allergies  Allergies Reconciled  Medication History  Medications Reconciled  Social History Caffeine use Carbonated beverages, Coffee, Tea. No alcohol use No drug use Tobacco use Never smoker.  Family History  Alcohol Abuse Father. Arthritis Mother. Cerebrovascular Accident Mother. Heart Disease Sister. Heart disease in male family member before age 29  Other Problems  Heart murmur Hemorrhoids Umbilical Hernia Repair     Review of Systems  General Not Present- Appetite Loss, Chills, Fatigue, Fever, Night Sweats, Weight Gain and Weight Loss. Skin Not Present- Change in Wart/Mole, Dryness, Hives, Jaundice, New Lesions, Non-Healing Wounds, Rash and Ulcer. HEENT Present- Ringing in the Ears, Seasonal Allergies and Wears glasses/contact lenses. Not Present- Earache, Hearing Loss, Hoarseness, Nose Bleed, Oral Ulcers, Sinus Pain, Sore Throat, Visual Disturbances and Yellow Eyes. Respiratory Not Present- Bloody sputum, Chronic Cough, Difficulty Breathing, Snoring and Wheezing. Breast Not Present- Breast Mass, Breast Pain, Nipple Discharge and Skin Changes. Gastrointestinal Present- Abdominal Pain. Not Present-  Bloating, Bloody Stool, Change in Bowel Habits, Chronic diarrhea, Constipation, Difficulty Swallowing, Excessive gas, Gets full quickly at meals, Hemorrhoids, Indigestion, Nausea, Rectal Pain and Vomiting. Male Genitourinary Not Present- Blood in Urine, Change in Urinary Stream, Frequency, Impotence, Nocturia, Painful Urination, Urgency and Urine Leakage. Musculoskeletal Present- Joint Pain. Not Present- Back Pain, Joint Stiffness, Muscle Pain, Muscle Weakness and Swelling of Extremities. Neurological Not Present- Decreased Memory, Fainting, Headaches, Numbness, Seizures, Tingling, Tremor, Trouble walking and Weakness. Psychiatric Not Present- Anxiety, Bipolar, Change in Sleep Pattern, Depression, Fearful and Frequent crying. Endocrine Not Present- Cold Intolerance, Excessive Hunger, Hair Changes, Heat Intolerance, Hot flashes and New Diabetes. Hematology Not Present- Blood Thinners, Easy Bruising, Excessive bleeding, Gland problems, HIV and Persistent Infections.  Vitals  Weight: 261.6 lb Height: 69in Body Surface Area: 2.32 m Body Mass Index: 38.63 kg/m  Temp.: 45F  Pulse: 103 (Regular)  BP: 130/84 (Sitting, Left Arm, Standard)        Physical Exam   The physical exam findings are as follows: Note:WDWN in NAD Eyes: Pupils equal, round; sclera anicteric HENT: Oral mucosa moist; good dentition Neck: No masses palpated, no thyromegaly Lungs: CTA bilaterally; normal respiratory effort CV: Regular rate and rhythm; no murmurs; extremities well-perfused with no edema Abd: +bowel sounds, soft, non-tender, no palpable organomegaly; protruding umbilical hernia - hernia sac about 5 cm in diameter With the patient supine and relaxed, the hernia slowly spontaneously reduces. I can palpate a 2 cm hernia defect in the umbilical fascia. There is no discoloration or thickening of the overlying skin. No drainage noted. Skin: Warm, dry; no sign of jaundice Psychiatric - alert  and oriented x 4; calm mood and affect    Assessment & Plan   UMBILICAL HERNIA WITHOUT OBSTRUCTION OR GANGRENE (K42.9)  Current Plans Schedule for Surgery - Umbilical hernia repair with  mesh. The surgical procedure has been discussed with the patient. Potential risks, benefits, alternative treatments, and expected outcomes have been explained. All of the patient's questions at this time have been answered. The likelihood of reaching the patient's treatment goal is good. The patient understand the proposed surgical procedure and wishes to proceed.   Wilmon Arms. Corliss Skains, MD, [facility]  General/ Trauma Surgery   06/17/2019 11:05 AM

## 2019-06-18 ENCOUNTER — Encounter: Payer: Self-pay | Admitting: *Deleted

## 2019-11-06 ENCOUNTER — Encounter: Payer: Self-pay | Admitting: Nurse Practitioner

## 2019-11-06 ENCOUNTER — Ambulatory Visit (INDEPENDENT_AMBULATORY_CARE_PROVIDER_SITE_OTHER): Payer: BC Managed Care – PPO | Admitting: Nurse Practitioner

## 2019-11-06 DIAGNOSIS — J00 Acute nasopharyngitis [common cold]: Secondary | ICD-10-CM

## 2019-11-06 MED ORDER — LORATADINE 10 MG PO TABS: 10.0000 mg | ORAL_TABLET | Freq: Every day | ORAL | 0 refills | Status: DC

## 2019-11-06 MED ORDER — SALINE SPRAY 0.65 % NA SOLN: 1.0000 | NASAL | 0 refills | Status: DC | PRN

## 2019-11-06 NOTE — Progress Notes (Signed)
Virtual Visit via telephone Note  I connected with Larry Paul on 11/06/19 at work by telephone and verified that I am speaking with the correct person using two identifiers. Larry Paul is currently located at work and no coworker is with the patient during visit. The provider, Daryll Drown, NP is located in their office at time of visit.   I discussed the limitations, risks, security and privacy concerns of performing an evaluation and management service by telephone and the availability of in person appointments. I also discussed with the patient that there may be a patient responsible charge related to this service. The patient expressed understanding and agreed to proceed.   History and Present Illness:  Patient is seen today via televisit for common cold. Patient reports symptoms started yesterday less than 24 hours. Patient is reporting postnasal drainage but no cough, headache, fever or sinus pressure. He is concerned he may have sinus infection due to past history. Patient has used over-the-counter allergy medication and is reporting mild to moderate therapeutic effect.   Outpatient Encounter Medications as of 11/06/2019  Medication Sig  . cholecalciferol (VITAMIN D3) 25 MCG (1000 UNIT) tablet Take 1,000 Units by mouth daily.  . magnesium 30 MG tablet Take 30 mg by mouth 2 (two) times daily.  . Melatonin 3 MG TABS Take 9 mg by mouth at bedtime.  . Multiple Vitamin (MULTIVITAMIN) tablet Take 1 tablet by mouth daily.  Marland Kitchen oxyCODONE (OXY IR/ROXICODONE) 5 MG immediate release tablet Take 1 tablet (5 mg total) by mouth every 6 (six) hours as needed for severe pain.   No facility-administered encounter medications on file as of 11/06/2019.    Review of Systems  Constitutional: Negative for appetite change, chills, fatigue and fever.  HENT: Positive for congestion and postnasal drip. Negative for ear discharge, ear pain and sinus pressure.   Eyes: Negative.   Respiratory: Negative  for cough, chest tightness, shortness of breath and wheezing.   Cardiovascular: Negative.   Gastrointestinal: Negative for nausea.  Musculoskeletal: Negative.   Skin: Negative for color change and rash.    Observations/Objective:   Assessment and Plan: Problem List Items Addressed This Visit      Respiratory   Common cold - Primary    Patient is a 49 year old male who is seen today via televisit for common cold. Patient reports that symptoms started yesterday less than 24 hours. He is at work today and symptoms are better than yesterday. Patient is reporting postnasal drainage but no cough, headache, fever or sinus pressure. Patient is concerned he may have sinus infection due to past history. Patient has used over-the-counter allergy medication and is reporting mild to moderate therapeutic effect. I Provided education to patient that an antibiotic will not be beneficial due to early onset of symptoms less than 24 hours, no fevers, headaches or signs and symptoms of sinus infection. Provided education over the phone. Patient is advised to increase hydration, decongestant, antiallergic medication. Patient knows to call with onset of fever, body aches and worsening symptoms in the next 2 days.          Follow up plan: Return if symptoms worsen or fail to improve.     I discussed the assessment and treatment plan with the patient. The patient was provided an opportunity to ask questions and all were answered. The patient agreed with the plan and demonstrated an understanding of the instructions.   The patient was advised to call back or seek an in-person evaluation if  the symptoms worsen or if the condition fails to improve as anticipated.  The above assessment and management plan was discussed with the patient. The patient verbalized understanding of and has agreed to the management plan. Patient is aware to call the clinic if symptoms persist or worsen. Patient is aware when to  return to the clinic for a follow-up visit. Patient educated on when it is appropriate to go to the emergency department.    I provided 9 minutes of non-face-to-face time during this encounter.    Daryll Drown, NP

## 2019-11-06 NOTE — Assessment & Plan Note (Signed)
Patient is a 49 year old male who is seen today via televisit for common cold. Patient reports that symptoms started yesterday less than 24 hours. He is at work today and symptoms are better than yesterday. Patient is reporting postnasal drainage but no cough, headache, fever or sinus pressure. Patient is concerned he may have sinus infection due to past history. Patient has used over-the-counter allergy medication and is reporting mild to moderate therapeutic effect. I Provided education to patient that an antibiotic will not be beneficial due to early onset of symptoms less than 24 hours, no fevers, headaches or signs and symptoms of sinus infection. Provided education over the phone. Patient is advised to increase hydration, decongestant, antiallergic medication. Patient knows to call with onset of fever, body aches and worsening symptoms in the next 2 days.

## 2019-11-27 ENCOUNTER — Other Ambulatory Visit: Payer: Self-pay | Admitting: Family Medicine

## 2019-11-27 DIAGNOSIS — J00 Acute nasopharyngitis [common cold]: Secondary | ICD-10-CM

## 2019-12-03 ENCOUNTER — Other Ambulatory Visit: Payer: Self-pay

## 2020-10-05 ENCOUNTER — Encounter: Payer: Self-pay | Admitting: Family Medicine

## 2020-10-05 ENCOUNTER — Other Ambulatory Visit: Payer: Self-pay

## 2020-10-05 ENCOUNTER — Ambulatory Visit (INDEPENDENT_AMBULATORY_CARE_PROVIDER_SITE_OTHER): Payer: BC Managed Care – PPO | Admitting: Family Medicine

## 2020-10-05 VITALS — BP 139/72 | HR 66 | Temp 97.4°F | Ht 71.0 in | Wt 279.4 lb

## 2020-10-05 DIAGNOSIS — R011 Cardiac murmur, unspecified: Secondary | ICD-10-CM | POA: Diagnosis not present

## 2020-10-05 DIAGNOSIS — R002 Palpitations: Secondary | ICD-10-CM

## 2020-10-05 DIAGNOSIS — R079 Chest pain, unspecified: Secondary | ICD-10-CM

## 2020-10-05 DIAGNOSIS — R12 Heartburn: Secondary | ICD-10-CM

## 2020-10-05 MED ORDER — OMEPRAZOLE 20 MG PO CPDR: 20.0000 mg | DELAYED_RELEASE_CAPSULE | Freq: Every day | ORAL | 2 refills | Status: DC

## 2020-10-05 NOTE — Progress Notes (Signed)
Acute Office Visit  Subjective:    Patient ID: Larry Paul, male    DOB: [birth year], 50 y.o.   MRN: [id number]  Chief Complaint  Patient presents with  . Palpitations    HPI Patient is in today for palpitations. This has been going on for years but has been more frequent for the last few months. He reports feeling palpitation a few times a week. They last for a few minutes at a time. They seem to occur randomly.  He does have some mild shortness of breath when he happens, this resolves quickly. He does drink about 5 cups of coffee a day. He does not feel anxious when this happens. He does reports chest pain that occurs once every few weeks. This has only been occurring for years but seems to be occurring for frequently lately. It feels like a pressure around his heart and lasts for a few minutes. It seems to occur randomly. He reports it self resolves after a few minutes. He isn't sure if it occurs with activity or rest. He also reports fatigue for the last few months. He does have a known heart murmur. He does report heartburn. He does not take medication for this. He denies pain that radiates to his left shoulder/arm/jaw, numbness, tingling, nausea, vomiting, or focal weakness. He denies dizziness or edema.      Past Medical History:  Diagnosis Date  . Allergy   . Hypertension    PRE  . Obesity   . Sleep apnea     Past Surgical History:  Procedure Laterality Date  . INSERTION OF MESH  06/17/2019   Procedure: INSERTION OF MESH;  Surgeon: Larry Mesa, MD;  Location: [facility];  Service: General;;  . NASAL SINUS SURGERY    . UMBILICAL HERNIA REPAIR N/A 06/17/2019   Procedure: UMBILICAL HERNIA REPAIR;  Surgeon: Larry Mesa, MD;  Location: [facility];  Service: General;  Laterality: N/A;    Family History  Problem Relation Age of Onset  . Diabetes Mother   . Arthritis Mother        RHEMATOID  . Early death Father   . Heart attack Father   .  Heart attack Sister   . Arthritis Sister     Social History   Socioeconomic History  . Marital status: Married    Spouse name: Not on file  . Number of children: Not on file  . Years of education: Not on file  . Highest education level: Not on file  Occupational History  . Not on file  Tobacco Use  . Smoking status: Never Smoker  . Smokeless tobacco: Never Used  Substance and Sexual Activity  . Alcohol use: No  . Drug use: No  . Sexual activity: Not on file  Other Topics Concern  . Not on file  Social History Narrative  . Not on file   Social Determinants of Health   Financial Resource Strain: Not on file  Food Insecurity: Not on file  Transportation Needs: Not on file  Physical Activity: Not on file  Stress: Not on file  Social Connections: Not on file  Intimate Partner Violence: Not on file    Outpatient Medications Prior to Visit  Medication Sig Dispense Refill  . Melatonin 10 MG TABS Take by mouth.    . Multiple Vitamin (MULTIVITAMIN) tablet Take 1 tablet by mouth daily.    Marland Kitchen oxyCODONE (OXY IR/ROXICODONE) 5 MG immediate release tablet Take 1 tablet (5 mg total)  by mouth every 6 (six) hours as needed for severe pain. 15 tablet 0  . cholecalciferol (VITAMIN D3) 25 MCG (1000 UNIT) tablet Take 1,000 Units by mouth daily.    Marland Kitchen loratadine (CLARITIN) 10 MG tablet TAKE 1 TABLET BY MOUTH EVERY DAY 90 tablet 0  . magnesium 30 MG tablet Take 30 mg by mouth 2 (two) times daily.    . Melatonin 3 MG TABS Take 9 mg by mouth at bedtime.    . sodium chloride (OCEAN) 0.65 % SOLN nasal spray Place 1 spray into both nostrils as needed for congestion. 60 mL 0   No facility-administered medications prior to visit.    No Known Allergies  Review of Systems As per HPI.     Objective:    Physical Exam Vitals and nursing note reviewed.  Constitutional:      General: He is not in acute distress.    Appearance: He is not ill-appearing or toxic-appearing.  Neck:     Thyroid: No  thyroid mass or thyroid tenderness.     Vascular: No carotid bruit or JVD.  Cardiovascular:     Rate and Rhythm: Normal rate and regular rhythm.  No extrasystoles are present.    Chest Wall: No thrill.     Heart sounds: Murmur heard.   Systolic murmur is present with a grade of 2/6. No friction rub. No gallop. No S3 or S4 sounds.   Pulmonary:     Effort: Pulmonary effort is normal. No respiratory distress.     Breath sounds: Normal breath sounds. No wheezing or rales.  Musculoskeletal:     Cervical back: Neck supple.     Right lower leg: No edema.     Left lower leg: No edema.  Neurological:     General: No focal deficit present.     Mental Status: He is alert and oriented to person, place, and time.  Psychiatric:        Mood and Affect: Mood normal.        Behavior: Behavior normal.     BP 139/72   Pulse 66   Temp (!) 97.4 F (36.3 C) (Oral)   Ht _0  (1.803 m)   Wt 279 lb 6 oz (126.7 kg)   BMI 38.96 kg/m  Wt Readings from Last 3 Encounters:  10/05/20 279 lb 6 oz (126.7 kg)  06/17/19 263 lb 14.3 oz (119.7 kg)  10/28/17 262 lb (118.8 kg)    Health Maintenance Due  Topic Date Due  . COLONOSCOPY (Pts 45-84yr Insurance coverage will need to be confirmed)  Never done    There are no preventive care reminders to display for this patient.   No results found for: TSH Lab Results  Component Value Date   WBC 6.8 03/04/2007   HGB 14.6 03/04/2007   HCT 43.2 03/04/2007   MCV 85.9 03/04/2007   PLT 243 03/04/2007   Lab Results  Component Value Date   NA 137 08/14/2012   K 4.4 08/14/2012   CO2 29 08/14/2012   GLUCOSE 84 08/14/2012   BUN 16 08/14/2012   CREATININE 1.03 08/14/2012   BILITOT 0.5 08/14/2012   ALKPHOS 51 08/14/2012   AST 32 08/14/2012   ALT 56 (H) 08/14/2012   PROT 7.3 08/14/2012   ALBUMIN 4.7 08/14/2012   CALCIUM 10.1 08/14/2012   Lab Results  Component Value Date   CHOL 139 08/14/2012   Lab Results  Component Value Date   HDL 37 (L)  08/14/2012  Lab Results  Component Value Date   LDLCALC 67 08/14/2012   Lab Results  Component Value Date   TRIG 173 (H) 08/14/2012   No results found for: CHOLHDL No results found for: HGBA1C     Assessment & Plan:   Larry Paul was seen today for palpitations.  Diagnoses and all orders for this visit:  Palpitations EKG with NSR today in office. Labs pending as below. Decrease caffeine intake. Given that he is experiencing intermittent chest pain, palpitations, and has a heart murmur, referral to cardiology has been placed.  -     EKG 12-Lead -     CMP14+EGFR -     CBC with Differential/Platelet -     TSH -     Ambulatory referral to Cardiology  Heart murmur -     Ambulatory referral to Cardiology  Chest pain, unspecified type Normal EKG today. Discussed when to seek emergency care. Handout given. Referral to cardiology placed.  -     Ambulatory referral to Cardiology  Heartburn Discussed heartburn could potentially be cause of chest pain. Start omeprazole daily.  -     omeprazole (PRILOSEC) 20 MG capsule; Take 1 capsule (20 mg total) by mouth daily.    Return in about 3 months (around 01/05/2021) for with PCP for CPE. Sooner for new or worsening symptoms.   The patient indicates understanding of these issues and agrees with the plan.   Gwenlyn Perking, FNP

## 2020-10-05 NOTE — Patient Instructions (Signed)

## 2020-10-06 LAB — CBC WITH DIFFERENTIAL/PLATELET
Basophils Absolute: 0.1 10*3/uL (ref 0.0–0.2)
Basos: 1 %
EOS (ABSOLUTE): 0.2 10*3/uL (ref 0.0–0.4)
Eos: 3 %
Hematocrit: 43.6 % (ref 37.5–51.0)
Hemoglobin: 14.9 g/dL (ref 13.0–17.7)
Immature Grans (Abs): 0.1 10*3/uL (ref 0.0–0.1)
Immature Granulocytes: 1 %
Lymphocytes Absolute: 0.9 10*3/uL (ref 0.7–3.1)
Lymphs: 17 %
MCH: 29.5 pg (ref 26.6–33.0)
MCHC: 34.2 g/dL (ref 31.5–35.7)
MCV: 86 fL (ref 79–97)
Monocytes Absolute: 0.7 10*3/uL (ref 0.1–0.9)
Monocytes: 12 %
Neutrophils Absolute: 3.7 10*3/uL (ref 1.4–7.0)
Neutrophils: 66 %
Platelets: 194 10*3/uL (ref 150–450)
RBC: 5.05 x10E6/uL (ref 4.14–5.80)
RDW: 13.6 % (ref 11.6–15.4)
WBC: 5.5 10*3/uL (ref 3.4–10.8)

## 2020-10-06 LAB — CMP14+EGFR
ALT: 132 IU/L — ABNORMAL HIGH (ref 0–44)
AST: 80 IU/L — ABNORMAL HIGH (ref 0–40)
Albumin/Globulin Ratio: 1.8 (ref 1.2–2.2)
Albumin: 4.5 g/dL (ref 3.8–4.9)
Alkaline Phosphatase: 57 IU/L (ref 44–121)
BUN/Creatinine Ratio: 19 (ref 9–20)
BUN: 20 mg/dL (ref 6–24)
Bilirubin Total: 0.5 mg/dL (ref 0.0–1.2)
CO2: 20 mmol/L (ref 20–29)
Calcium: 9.1 mg/dL (ref 8.7–10.2)
Chloride: 104 mmol/L (ref 96–106)
Creatinine, Ser: 1.03 mg/dL (ref 0.76–1.27)
Globulin, Total: 2.5 g/dL (ref 1.5–4.5)
Glucose: 139 mg/dL — ABNORMAL HIGH (ref 65–99)
Potassium: 4.9 mmol/L (ref 3.5–5.2)
Sodium: 141 mmol/L (ref 134–144)
Total Protein: 7 g/dL (ref 6.0–8.5)
eGFR: 88 mL/min/{1.73_m2} (ref >59)

## 2020-10-06 LAB — TSH: TSH: 0.704 u[IU]/mL (ref 0.450–4.500)

## 2020-10-06 NOTE — Progress Notes (Signed)
R/C About labs

## 2020-10-20 ENCOUNTER — Encounter: Payer: Self-pay | Admitting: Family Medicine

## 2021-02-14 NOTE — Progress Notes (Deleted)
Cardiology Office Note   Date:  02/14/2021   ID:  Larry Paul, DOB November 12, 1970, MRN [id number]  PCP:  Dettinger, Elige Radon, MD  Cardiologist:   None Referring:  ***  No chief complaint on file.     History of Present Illness: Larry Paul is a 50 y.o. male who is referred by *** for evaluation of palpitations, murmur and chest pain.  ***    Past Medical History:  Diagnosis Date   Allergy    Hypertension    PRE   Obesity    Sleep apnea     Past Surgical History:  Procedure Laterality Date   INSERTION OF MESH  06/17/2019   Procedure: INSERTION OF MESH;  Surgeon: Manus Rudd, MD;  Location: [facility];  Service: General;;   NASAL SINUS SURGERY     UMBILICAL HERNIA REPAIR N/A 06/17/2019   Procedure: UMBILICAL HERNIA REPAIR;  Surgeon: Manus Rudd, MD;  Location: [facility];  Service: General;  Laterality: N/A;     Current Outpatient Medications  Medication Sig Dispense Refill   Melatonin 10 MG TABS Take by mouth.     Multiple Vitamin (MULTIVITAMIN) tablet Take 1 tablet by mouth daily.     omeprazole (PRILOSEC) 20 MG capsule Take 1 capsule (20 mg total) by mouth daily. 30 capsule 2   oxyCODONE (OXY IR/ROXICODONE) 5 MG immediate release tablet Take 1 tablet (5 mg total) by mouth every 6 (six) hours as needed for severe pain. 15 tablet 0   No current facility-administered medications for this visit.    Allergies:   Patient has no known allergies.    Social History:  The patient  reports that he has never smoked. He has never used smokeless tobacco. He reports that he does not drink alcohol and does not use drugs.   Family History:  The patient's ***family history includes Arthritis in his mother and sister; Diabetes in his mother; Early death in his father; Heart attack in his father and sister.    ROS:  Please see the history of present illness.   Otherwise, review of systems are positive for {NONE DEFAULTED:18576}.   All other  systems are reviewed and negative.    PHYSICAL EXAM: VS:  There were no vitals taken for this visit. , BMI There is no height or weight on file to calculate BMI. GENERAL:  Well appearing HEENT:  Pupils equal round and reactive, fundi not visualized, oral mucosa unremarkable NECK:  No jugular venous distention, waveform within normal limits, carotid upstroke brisk and symmetric, no bruits, no thyromegaly LYMPHATICS:  No cervical, inguinal adenopathy LUNGS:  Clear to auscultation bilaterally BACK:  No CVA tenderness CHEST:  Unremarkable HEART:  PMI not displaced or sustained,S1 and S2 within normal limits, no S3, no S4, no clicks, no rubs, *** murmurs ABD:  Flat, positive bowel sounds normal in frequency in pitch, no bruits, no rebound, no guarding, no midline pulsatile mass, no hepatomegaly, no splenomegaly EXT:  2 plus pulses throughout, no edema, no cyanosis no clubbing SKIN:  No rashes no nodules NEURO:  Cranial nerves II through XII grossly intact, motor grossly intact throughout PSYCH:  Cognitively intact, oriented to person place and time    EKG:  EKG {ACTION; IS/IS DZH:[id number]} ordered today. The ekg ordered today demonstrates ***   Recent Labs: 10/05/2020: ALT 132; BUN 20; Creatinine, Ser 1.03; Hemoglobin 14.9; Platelets 194; Potassium 4.9; Sodium 141; TSH 0.704    Lipid Panel    Component Value  Date/Time   CHOL 139 08/14/2012 1456   TRIG 173 (H) 08/14/2012 1456   HDL 37 (L) 08/14/2012 1456   LDLCALC 67 08/14/2012 1456      Wt Readings from Last 3 Encounters:  10/05/20 279 lb 6 oz (126.7 kg)  06/17/19 263 lb 14.3 oz (119.7 kg)  10/28/17 262 lb (118.8 kg)      Other studies Reviewed: Additional studies/ records that were reviewed today include: ***. Review of the above records demonstrates:  Please see elsewhere in the note.  ***   ASSESSMENT AND PLAN:  CHEST PAIN:  ***  PALPITATIONS:  ***   MURMUR:  ***  Current medicines are reviewed at length with  the patient today.  The patient {ACTIONS; HAS/DOES NOT HAVE:19233} concerns regarding medicines.  The following changes have been made:  {PLAN; NO CHANGE:13088:s}  Labs/ tests ordered today include: *** No orders of the defined types were placed in this encounter.    Disposition:   FU with ***    Signed, Rollene Rotunda, MD  02/14/2021 11:23 AM    [facility]

## 2021-02-15 ENCOUNTER — Ambulatory Visit: Payer: Self-pay | Admitting: Cardiology

## 2021-02-15 DIAGNOSIS — R011 Cardiac murmur, unspecified: Secondary | ICD-10-CM

## 2021-02-15 DIAGNOSIS — R072 Precordial pain: Secondary | ICD-10-CM

## 2021-02-15 DIAGNOSIS — R002 Palpitations: Secondary | ICD-10-CM

## 2021-03-27 ENCOUNTER — Encounter: Payer: Self-pay | Admitting: Cardiology

## 2021-03-27 DIAGNOSIS — R011 Cardiac murmur, unspecified: Secondary | ICD-10-CM | POA: Insufficient documentation

## 2021-03-27 DIAGNOSIS — R002 Palpitations: Secondary | ICD-10-CM | POA: Insufficient documentation

## 2021-03-27 DIAGNOSIS — R072 Precordial pain: Secondary | ICD-10-CM | POA: Insufficient documentation

## 2021-03-27 NOTE — Progress Notes (Signed)
Cardiology Office Note   Date:  03/29/2021   ID:  Larry Paul, DOB 10-23-1970, MRN [id number]  PCP:  Dettinger, Elige Radon, MD  Cardiologist:   None Referring:  Dettinger, Elige Radon, MD  Chief Complaint  Patient presents with   Palpitations       History of Present Illness: Larry Paul is a 50 y.o. male who presents for evaluation of palpitations, murmur and chest pain.   He is referred by Dettinger, Elige Radon, MD.  He was told years ago that he had a heart murmur.  He has never had any other cardiac work-up.  He has had some fleeting chest discomfort.  This happens sporadically.  He cannot make it come on.  Seems to be mild.  Likely mid chest.  There is not appear to be associated symptoms such as nausea vomiting or diaphoresis.  He cannot bring it on.  He does not remember the last time he had it necessarily.  He does get palpitations.  He feels his heart beating fast.  He has a hard time quantifying qualifying it but it seems to happen sporadically.  Neither of these symptoms are really reversible with the activities he does on his job.  He does not exercise routinely but he says some of his tasks at work.  I do note that he had normal electrolytes and normal TSH earlier this year.   Past Medical History:  Diagnosis Date   Hypertension    Obesity    Sleep apnea    CPAP    Past Surgical History:  Procedure Laterality Date   INSERTION OF MESH  06/17/2019   Procedure: INSERTION OF MESH;  Surgeon: Manus Rudd, MD;  Location: [facility];  Service: General;;   NASAL SINUS SURGERY     UMBILICAL HERNIA REPAIR N/A 06/17/2019   Procedure: UMBILICAL HERNIA REPAIR;  Surgeon: Manus Rudd, MD;  Location: [facility];  Service: General;  Laterality: N/A;     Current Outpatient Medications  Medication Sig Dispense Refill   fluticasone (FLONASE) 50 MCG/ACT nasal spray Place 2 sprays into both nostrils daily.     Melatonin 10 MG TABS Take by mouth.      Multiple Vitamin (MULTIVITAMIN) tablet Take 1 tablet by mouth daily.     omeprazole (PRILOSEC) 20 MG capsule Take 1 capsule (20 mg total) by mouth daily. (Patient not taking: Reported on 03/29/2021) 30 capsule 2   oxyCODONE (OXY IR/ROXICODONE) 5 MG immediate release tablet Take 1 tablet (5 mg total) by mouth every 6 (six) hours as needed for severe pain. (Patient not taking: Reported on 03/29/2021) 15 tablet 0   No current facility-administered medications for this visit.    Allergies:   Patient has no known allergies.    Social History:  The patient  reports that he has never smoked. He has never used smokeless tobacco. He reports that he does not drink alcohol and does not use drugs.   Family History:  The patient's family history includes Arthritis in his mother and sister; Diabetes in his mother; Early death in his father; Sudden death in his sister.    ROS:  Please see the history of present illness.   Otherwise, review of systems are positive for none.   All other systems are reviewed and negative.    PHYSICAL EXAM: VS:  BP 140/90   Pulse 73   Ht 5\' 10"  (1.778 m)   Wt 266 lb (120.7 kg)  BMI 38.17 kg/m  , BMI Body mass index is 38.17 kg/m. GENERAL:  Well appearing HEENT:  Pupils equal round and reactive, fundi not visualized, oral mucosa unremarkable NECK:  No jugular venous distention, waveform within normal limits, carotid upstroke brisk and symmetric, no bruits, no thyromegaly LYMPHATICS:  No cervical, inguinal adenopathy LUNGS:  Clear to auscultation bilaterally BACK:  No CVA tenderness CHEST:  Unremarkable HEART:  PMI not displaced or sustained,S1 and S2 within normal limits, no S3, no S4, no clicks, no rubs, no murmurs ABD:  Flat, positive bowel sounds normal in frequency in pitch, no bruits, no rebound, no guarding, no midline pulsatile mass, no hepatomegaly, no splenomegaly EXT:  2 plus pulses throughout, no edema, no cyanosis no clubbing SKIN:  No rashes no  nodules NEURO:  Cranial nerves II through XII grossly intact, motor grossly intact throughout PSYCH:  Cognitively intact, oriented to person place and time    EKG:  EKG is ordered today. The ekg ordered today demonstrates sinus rhythm, rate 73 within normal limit intervals within normal limits, no acute ST-T wave changes.   Recent Labs: 10/05/2020: ALT 132; BUN 20; Creatinine, Ser 1.03; Hemoglobin 14.9; Platelets 194; Potassium 4.9; Sodium 141; TSH 0.704    Lipid Panel    Component Value Date/Time   CHOL 139 08/14/2012 1456   TRIG 173 (H) 08/14/2012 1456   HDL 37 (L) 08/14/2012 1456   LDLCALC 67 08/14/2012 1456      Wt Readings from Last 3 Encounters:  03/29/21 266 lb (120.7 kg)  10/05/20 279 lb 6 oz (126.7 kg)  06/17/19 263 lb 14.3 oz (119.7 kg)      Other studies Reviewed: Additional studies/ records that were reviewed today include: Labs. Review of the above records demonstrates:  Please see elsewhere in the note.     ASSESSMENT AND PLAN:  PALPITATIONS: The patient has some rare palpitations.  At this point he does not think they are frequent enough to evaluated bradycardia.  He would not necessarily need therapy.  He does drink a lot of caffeine and we talked about this.  He will let me know if they get worse in the future.  MURMUR: I do not appreciate a heart murmur.  No further testing is indicated.  CHEST PAIN: He does have some vague chest discomfort.  There is a family history of sudden death and he does not know really any of the details.  I think it is reasonable given this to screen him with a coronary calcium score.  Further testing will be based on this.  I would also like to check a lipid profile for risk stratification.  Current medicines are reviewed at length with the patient today.  The patient does not have concerns regarding medicines.  The following changes have been made:  no change  Labs/ tests ordered today include:   Orders Placed This  Encounter  Procedures   CT CARDIAC SCORING (SELF PAY ONLY)   Lipid panel   EKG 12-Lead      Disposition:   FU with me as needed and based on the results of the above   Signed, Rollene Rotunda, MD  03/29/2021 3:22 PM    [facility]

## 2021-03-29 ENCOUNTER — Other Ambulatory Visit: Payer: Self-pay | Admitting: *Deleted

## 2021-03-29 ENCOUNTER — Ambulatory Visit (INDEPENDENT_AMBULATORY_CARE_PROVIDER_SITE_OTHER): Payer: Self-pay | Admitting: Cardiology

## 2021-03-29 ENCOUNTER — Other Ambulatory Visit: Payer: Self-pay

## 2021-03-29 ENCOUNTER — Encounter: Payer: Self-pay | Admitting: Cardiology

## 2021-03-29 VITALS — BP 140/90 | HR 73 | Ht 70.0 in | Wt 266.0 lb

## 2021-03-29 DIAGNOSIS — R011 Cardiac murmur, unspecified: Secondary | ICD-10-CM

## 2021-03-29 DIAGNOSIS — R002 Palpitations: Secondary | ICD-10-CM

## 2021-03-29 DIAGNOSIS — E785 Hyperlipidemia, unspecified: Secondary | ICD-10-CM

## 2021-03-29 DIAGNOSIS — R072 Precordial pain: Secondary | ICD-10-CM

## 2021-03-29 NOTE — Patient Instructions (Signed)
Medication Instructions:  The current medical regimen is effective;  continue present plan and medications.  *If you need a refill on your cardiac medications before your next appointment, please call your pharmacy*   Lab Work: Please have fasting Lipid panel.  This can be completed at [facility] If you have labs (blood work) drawn today and your tests are completely normal, you will receive your results only by: MyChart Message (if you have MyChart) OR A paper copy in the mail If you have any lab test that is abnormal or we need to change your treatment, we will call you to review the results.   Testing/Procedures: Your physician has requested that you have Coronary Calcium Score which is completed by CT scan. Cardiac computed tomography (CT) is a painless test that uses an x-ray machine to take clear, detailed pictures of your heart. You will be contacted to be scheduled for this testing .  The cost is $99 due at the time of the test. [address] [place], [place] [address].  Follow-Up: At [facility], you and your health needs are our priority.  As part of our continuing mission to provide you with exceptional heart care, we have created designated Provider Care Teams.  These Care Teams include your primary Cardiologist (physician) and Advanced Practice Providers (APPs -  Physician Assistants and Nurse Practitioners) who all work together to provide you with the care you need, when you need it.  We recommend signing up for the patient portal called "MyChart".  Sign up information is provided on this After Visit Summary.  MyChart is used to connect with patients for Virtual Visits (Telemedicine).  Patients are able to view lab/test results, encounter notes, upcoming appointments, etc.  Non-urgent messages can be sent to your provider as well.   To learn more about what you can do with MyChart, go to [email or web address].    Your next appointment:   Follow up will be based on the  results of the abovetesting.  Thank you for choosing [facility]!!

## 2021-04-20 ENCOUNTER — Inpatient Hospital Stay: Admission: RE | Admit: 2021-04-20 | Payer: Self-pay | Source: Ambulatory Visit

## 2021-12-25 ENCOUNTER — Telehealth: Payer: Self-pay

## 2021-12-25 NOTE — Chronic Care Management (AMB) (Signed)
  Care Coordination  Note  12/25/2021 Name: Larry Paul MRN: [id number] DOB: [birth year]  Larry Paul is a 51 y.o. year old male who is a primary care patient of Dettinger, Elige Radon, MD. I reached out to Catalina Lunger by phone today to offer care coordination services.      Mr. Hoar was given information about Care Coordination services today including:  The Care Coordination services include support from the care team which includes your Nurse Coordinator, Clinical Social Worker, or Pharmacist.  The Care Coordination team is here to help remove barriers to the health concerns and goals most important to you. Care Coordination services are voluntary and the patient may decline or stop services at any time by request to their care team member.   Patient did not agree to participate in care coordination services at this time.  Follow up plan: Patient declines further follow up or participation in care coordination services.   Penne Lash, RMA Care Guide [facility] [place], [place] [address] Direct Dial: [phone] [email or web address]

## 2022-11-26 DIAGNOSIS — I10 Essential (primary) hypertension: Secondary | ICD-10-CM | POA: Diagnosis not present

## 2022-11-26 DIAGNOSIS — U071 COVID-19: Secondary | ICD-10-CM | POA: Diagnosis not present

## 2022-11-28 ENCOUNTER — Ambulatory Visit: Payer: BC Managed Care – PPO | Admitting: Family Medicine

## 2022-11-28 ENCOUNTER — Encounter: Payer: Self-pay | Admitting: Family Medicine

## 2022-11-28 VITALS — BP 174/102 | HR 96 | Ht 70.0 in | Wt 272.0 lb

## 2022-11-28 DIAGNOSIS — U071 COVID-19: Secondary | ICD-10-CM

## 2022-11-28 DIAGNOSIS — I10 Essential (primary) hypertension: Secondary | ICD-10-CM | POA: Diagnosis not present

## 2022-11-28 MED ORDER — AMLODIPINE BESYLATE 5 MG PO TABS: 5.0000 mg | ORAL_TABLET | Freq: Every day | ORAL | 1 refills | Status: DC

## 2022-11-28 NOTE — Progress Notes (Signed)
BP (!) 174/102   Pulse 96   Ht 5\' 10"  (1.778 m)   Wt 272 lb (123.4 kg)   SpO2 96%   BMI 39.03 kg/m    Subjective:   Patient ID: Larry Paul, male    DOB: [birth year], 52 y.o.   MRN: [id number]  HPI: Larry Paul is a 52 y.o. male presenting on 11/28/2022 for Hypertension   HPI Hypertension Patient is currently on no medication, and their blood pressure today is 174/102 and the other day at [facility] it was 181/117 which got alarmed.  He is ill currently with COVID.Marland Kitchen Patient denies any lightheadedness or dizziness. Patient denies headaches, blurred vision, chest pains, shortness of breath, or weakness. Denies any side effects from medication and is content with current medication.   He recently tested positive for COVID and is improving from that with symptoms, still has some congestion but denies any fevers or chills or shortness of breath.  He is treating it with DayQuil  Relevant past medical, surgical, family and social history reviewed and updated as indicated. Interim medical history since our last visit reviewed. Allergies and medications reviewed and updated.  Review of Systems  Constitutional:  Negative for chills and fever.  HENT:  Positive for congestion, postnasal drip, rhinorrhea, sinus pressure, sneezing and sore throat. Negative for ear discharge, ear pain and voice change.   Eyes:  Negative for pain, discharge, redness and visual disturbance.  Respiratory:  Positive for cough. Negative for shortness of breath and wheezing.   Cardiovascular:  Negative for chest pain and leg swelling.  Musculoskeletal:  Negative for gait problem.  Skin:  Negative for rash.  Neurological:  Negative for dizziness, light-headedness and headaches.  All other systems reviewed and are negative.   Per HPI unless specifically indicated above   Allergies as of 11/28/2022   No Known Allergies      Medication List        Accurate as of November 28, 2022  4:30 PM. If you have any  questions, ask your nurse or doctor.          STOP taking these medications    fluticasone 50 MCG/ACT nasal spray Commonly known as: FLONASE Stopped by: Elige Radon Emy Angevine   omeprazole 20 MG capsule Commonly known as: PRILOSEC Stopped by: Elige Radon Harley Fitzwater   oxyCODONE 5 MG immediate release tablet Commonly known as: Oxy IR/ROXICODONE Stopped by: Elige Radon Vernis Eid       TAKE these medications    amLODipine 5 MG tablet Commonly known as: NORVASC Take 1 tablet (5 mg total) by mouth daily. Started by: Elige Radon Ansel Ferrall   Melatonin 10 MG Tabs Take by mouth.   multivitamin tablet Take 1 tablet by mouth daily.         Objective:   BP (!) 174/102   Pulse 96   Ht 5\' 10"  (1.778 m)   Wt 272 lb (123.4 kg)   SpO2 96%   BMI 39.03 kg/m   Wt Readings from Last 3 Encounters:  11/28/22 272 lb (123.4 kg)  03/29/21 266 lb (120.7 kg)  10/05/20 279 lb 6 oz (126.7 kg)    Physical Exam Vitals and nursing note reviewed.  Constitutional:      General: He is not in acute distress.    Appearance: He is well-developed. He is not diaphoretic.  Eyes:     General: No scleral icterus.    Conjunctiva/sclera: Conjunctivae normal.  Neck:     Thyroid: No thyromegaly.  Cardiovascular:     Rate and Rhythm: Normal rate and regular rhythm.     Heart sounds: Normal heart sounds. No murmur heard. Pulmonary:     Effort: Pulmonary effort is normal. No respiratory distress.     Breath sounds: Normal breath sounds. No wheezing.  Musculoskeletal:     Cervical back: Neck supple.  Lymphadenopathy:     Cervical: No cervical adenopathy.  Skin:    General: Skin is warm and dry.     Findings: No rash.  Neurological:     Mental Status: He is alert and oriented to person, place, and time.     Coordination: Coordination normal.  Psychiatric:        Behavior: Behavior normal.       Assessment & Plan:   Problem List Items Addressed This Visit       Cardiovascular and Mediastinum    HTN (hypertension) - Primary   Relevant Medications   amLODipine (NORVASC) 5 MG tablet   Other Relevant Orders   CMP14+EGFR   Other Visit Diagnoses     COVID-19 virus infection           Recommended Mucinex for his COVID and return to work tomorrow.  Will start amlodipine and have him come back in 1 month to reevaluate from there.  Follow up plan: Return if symptoms worsen or fail to improve.  Counseling provided for all of the vaccine components Orders Placed This Encounter  Procedures   CMP14+EGFR    Arville Care, MD [facility] 11/28/2022, 4:30 PM

## 2023-01-03 ENCOUNTER — Ambulatory Visit: Payer: BC Managed Care – PPO | Admitting: Family Medicine

## 2023-02-15 ENCOUNTER — Ambulatory Visit: Payer: BC Managed Care – PPO | Admitting: Family Medicine

## 2023-02-15 ENCOUNTER — Encounter: Payer: Self-pay | Admitting: Family Medicine

## 2023-02-15 VITALS — BP 162/91 | HR 76 | Ht 70.0 in | Wt 280.0 lb

## 2023-02-15 DIAGNOSIS — R7989 Other specified abnormal findings of blood chemistry: Secondary | ICD-10-CM | POA: Diagnosis not present

## 2023-02-15 DIAGNOSIS — Z6841 Body Mass Index (BMI) 40.0 and over, adult: Secondary | ICD-10-CM

## 2023-02-15 DIAGNOSIS — I10 Essential (primary) hypertension: Secondary | ICD-10-CM | POA: Diagnosis not present

## 2023-02-15 DIAGNOSIS — E785 Hyperlipidemia, unspecified: Secondary | ICD-10-CM

## 2023-02-15 MED ORDER — AMLODIPINE BESYLATE 5 MG PO TABS: 5.0000 mg | ORAL_TABLET | Freq: Every day | ORAL | 3 refills | Status: DC

## 2023-02-15 NOTE — Progress Notes (Signed)
BP (!) 162/91   Pulse 76   Ht 5\' 10"  (1.778 m)   Wt 280 lb (127 kg)   SpO2 97%   BMI 40.18 kg/m    Subjective:   Patient ID: Larry Paul, male    DOB: July 17, 1970, 52 y.o.   MRN: [id number]  HPI: Larry Paul is a 52 y.o. male presenting on 02/15/2023 for Medical Management of Chronic Issues and Hypertension   HPI Hypertension Patient is currently on amlodipine, and their blood pressure today is 153/81 but he says has been checking it regularly at home and has been running in the 130s over 80s the majority of the time at home.. Patient denies any lightheadedness or dizziness. Patient denies headaches, blurred vision, chest pains, shortness of breath, or weakness. Denies any side effects from medication and is content with current medication.   Hyperlipidemia Patient is coming in for recheck of his hyperlipidemia. The patient is currently taking no medicine currently, trying diet control. They deny any issues with myalgias or history of liver damage from it. They deny any focal numbness or weakness or chest pain.   Patient's hypertension and hyperlipidemia are more complicated by the patient's morbid obesity.  Discussed weight loss and lifestyle modification and exercise with the patient.  Patient had elevated liver function on the last 1.  And we will recheck those liver functions.  He is just trying to start with the diet again because he has gained weight up again and he is going to try and aim towards a 7 to 10% reduction in his weight over the next year.  Relevant past medical, surgical, family and social history reviewed and updated as indicated. Interim medical history since our last visit reviewed. Allergies and medications reviewed and updated.  Review of Systems  Constitutional:  Negative for chills and fever.  Eyes:  Negative for visual disturbance.  Respiratory:  Negative for shortness of breath and wheezing.   Cardiovascular:  Negative for chest pain and leg  swelling.  Musculoskeletal:  Negative for back pain and gait problem.  Skin:  Negative for rash.  Neurological:  Negative for dizziness, weakness and light-headedness.  All other systems reviewed and are negative.   Per HPI unless specifically indicated above   Allergies as of 02/15/2023   No Known Allergies      Medication List        Accurate as of February 15, 2023  4:06 PM. If you have any questions, ask your nurse or doctor.          amLODipine 5 MG tablet Commonly known as: NORVASC Take 1 tablet (5 mg total) by mouth daily.   Melatonin 10 MG Tabs Take by mouth.   multivitamin tablet Take 1 tablet by mouth daily.         Objective:   BP (!) 162/91   Pulse 76   Ht 5\' 10"  (1.778 m)   Wt 280 lb (127 kg)   SpO2 97%   BMI 40.18 kg/m   Wt Readings from Last 3 Encounters:  02/15/23 280 lb (127 kg)  11/28/22 272 lb (123.4 kg)  03/29/21 266 lb (120.7 kg)    Physical Exam Vitals and nursing note reviewed.  Constitutional:      General: He is not in acute distress.    Appearance: He is well-developed. He is not diaphoretic.  Eyes:     General: No scleral icterus.    Conjunctiva/sclera: Conjunctivae normal.  Neck:     Thyroid:  No thyromegaly.  Cardiovascular:     Rate and Rhythm: Normal rate and regular rhythm.     Heart sounds: Normal heart sounds. No murmur heard. Pulmonary:     Effort: Pulmonary effort is normal. No respiratory distress.     Breath sounds: Normal breath sounds. No wheezing.  Musculoskeletal:        General: No swelling. Normal range of motion.     Cervical back: Neck supple.  Lymphadenopathy:     Cervical: No cervical adenopathy.  Skin:    General: Skin is warm and dry.     Findings: No rash.  Neurological:     Mental Status: He is alert and oriented to person, place, and time.     Coordination: Coordination normal.  Psychiatric:        Behavior: Behavior normal.       Assessment & Plan:   Problem List Items  Addressed This Visit       Cardiovascular and Mediastinum   HTN (hypertension) - Primary   Relevant Medications   amLODipine (NORVASC) 5 MG tablet     Other   Dyslipidemia   Relevant Orders   CMP14+EGFR   Lipid panel   Morbid obesity (HCC)   Other Visit Diagnoses     Elevated liver function tests       Relevant Orders   CMP14+EGFR   Lipid panel       Sounds like blood pressure is running better at home, continue to monitor. Follow up plan: Return in about 3 months (around 05/18/2023), or if symptoms worsen or fail to improve, for htn recheck.  Counseling provided for all of the vaccine components Orders Placed This Encounter  Procedures   CMP14+EGFR   Lipid panel    Arville Care, MD [facility] 02/15/2023, 4:06 PM

## 2023-02-16 LAB — CMP14+EGFR
ALT: 68 [IU]/L — ABNORMAL HIGH (ref 0–44)
AST: 37 [IU]/L (ref 0–40)
Albumin: 4.6 g/dL (ref 3.8–4.9)
Alkaline Phosphatase: 61 [IU]/L (ref 44–121)
BUN/Creatinine Ratio: 15 (ref 9–20)
BUN: 15 mg/dL (ref 6–24)
Bilirubin Total: 0.4 mg/dL (ref 0.0–1.2)
CO2: 23 mmol/L (ref 20–29)
Calcium: 9.8 mg/dL (ref 8.7–10.2)
Chloride: 103 mmol/L (ref 96–106)
Creatinine, Ser: 0.99 mg/dL (ref 0.76–1.27)
Globulin, Total: 2.2 g/dL (ref 1.5–4.5)
Glucose: 131 mg/dL — ABNORMAL HIGH (ref 70–99)
Potassium: 4.4 mmol/L (ref 3.5–5.2)
Sodium: 139 mmol/L (ref 134–144)
Total Protein: 6.8 g/dL (ref 6.0–8.5)
eGFR: 91 mL/min/{1.73_m2} (ref >59)

## 2023-02-16 LAB — LIPID PANEL
Chol/HDL Ratio: 3.6 {ratio} (ref 0.0–5.0)
Cholesterol, Total: 150 mg/dL (ref 100–199)
HDL: 42 mg/dL (ref >39)
LDL Chol Calc (NIH): 87 mg/dL (ref 0–99)
Triglycerides: 114 mg/dL (ref 0–149)
VLDL Cholesterol Cal: 21 mg/dL (ref 5–40)

## 2023-05-17 ENCOUNTER — Ambulatory Visit: Payer: BC Managed Care – PPO | Admitting: Family Medicine

## 2023-05-20 ENCOUNTER — Encounter: Payer: Self-pay | Admitting: Family Medicine

## 2023-10-16 ENCOUNTER — Telehealth: Payer: Self-pay

## 2023-10-16 NOTE — Telephone Encounter (Signed)
 Virtual appt scheduled for 6/19 at 4:30pm. Pt aware.

## 2023-10-16 NOTE — Telephone Encounter (Signed)
 Copied from CRM [id number]. Topic: Medical Record Request - Provider/Facility Request >> Oct 16, 2023 10:11 AM Donald Frost wrote: Reason for CRM: The spouse of the patient called in stating she spoke with [facility] regarding her husbands last sleep study done back in April of 2008. The patient is requesting a new cpap since his is 54 years old just to be safe and all that needs to be done is for the provider to send a faxed medical records request over to [phone] to the attention of [employer] who is the medical Supervisor there. The patient would then need a new script to take with him to a medical supply company to purchase the new cpap. They need this done by June 25th as he has met his deductible and things will change by the 1st of July. I looked to schedule him an appt but nothing was available until the end of July and he needs this by the end of the month. Please assist patient further

## 2023-10-16 NOTE — Telephone Encounter (Signed)
 We have not discussed her smoking or documented sleep apnea in quite a few years so she will need an appointment to discuss it.  He can be put on a cancellation list to see if we can get him in sooner but I cannot send over supporting documentation for sleep apnea if we have not discussed it and documented it from a visit.

## 2023-10-16 NOTE — Telephone Encounter (Signed)
 Pt last seen 01/2023. He has a virtual appt scheduled for 7/228/25 for a referral to gi. Do you want him seen to discuss CPAP?

## 2023-10-17 ENCOUNTER — Telehealth (INDEPENDENT_AMBULATORY_CARE_PROVIDER_SITE_OTHER): Admitting: Family Medicine

## 2023-10-17 ENCOUNTER — Encounter: Payer: Self-pay | Admitting: Family Medicine

## 2023-10-17 DIAGNOSIS — G4733 Obstructive sleep apnea (adult) (pediatric): Secondary | ICD-10-CM

## 2023-10-17 NOTE — Progress Notes (Signed)
   Virtual Visit via MyChart video note  I connected with Larry Paul on 10/17/23 at 1626 by video and verified that I am speaking with the correct person using two identifiers. Larry Paul is currently located at home and patient are currently with her during visit. The provider, Lucio Sabin Ericah Scotto, MD is located in their office at time of visit.  Call ended at 1639  I discussed the limitations, risks, security and privacy concerns of performing an evaluation and management service by video and the availability of in person appointments. I also discussed with the patient that there may be a patient responsible charge related to this service. The patient expressed understanding and agreed to proceed.   History and Present Illness: Sleep apnea Patient uses his sleep apnea machine 100 percent of the night Patient uses a sleep apnea machine 30 days of the month Patient feels like his CPAP makes a significant difference in his sleep and energy. Patient is on CPAP settings 17 mm hg.  Current machine is 54 years old and received a new machine that is 54 years old and it does not work.    1. OSA on CPAP     Outpatient Encounter Medications as of 10/17/2023  Medication Sig   amLODipine  (NORVASC ) 5 MG tablet Take 1 tablet (5 mg total) by mouth daily.   Melatonin 10 MG TABS Take by mouth.   Multiple Vitamin (MULTIVITAMIN) tablet Take 1 tablet by mouth daily.   No facility-administered encounter medications on file as of 10/17/2023.    Review of Systems  Constitutional:  Negative for chills and fever.  Eyes:  Negative for visual disturbance.  Respiratory:  Negative for shortness of breath and wheezing.   Cardiovascular:  Negative for chest pain and leg swelling.  Musculoskeletal:  Negative for back pain and gait problem.  Skin:  Negative for rash.  Neurological:  Negative for dizziness and light-headedness.  All other systems reviewed and are  negative.   Observations/Objective: Patient sounds comfortable and in no acute distress  Assessment and Plan: Problem List Items Addressed This Visit       Respiratory   OSA on CPAP - Primary   Relevant Orders   For home use only DME continuous positive airway pressure (CPAP)    Will send new prescription and they will let us  know where to fax it tomorrow.  Follow up plan: Return if symptoms worsen or fail to improve.     I discussed the assessment and treatment plan with the patient. The patient was provided an opportunity to ask questions and all were answered. The patient agreed with the plan and demonstrated an understanding of the instructions.   The patient was advised to call back or seek an in-person evaluation if the symptoms worsen or if the condition fails to improve as anticipated.  The above assessment and management plan was discussed with the patient. The patient verbalized understanding of and has agreed to the management plan. Patient is aware to call the clinic if symptoms persist or worsen. Patient is aware when to return to the clinic for a follow-up visit. Patient educated on when it is appropriate to go to the emergency department.    I provided 13 minutes of non-face-to-face time during this encounter.    Hilton Lucky, MD

## 2023-10-31 ENCOUNTER — Telehealth: Payer: Self-pay

## 2023-10-31 NOTE — Telephone Encounter (Signed)
 Copied from CRM [id number]. Topic: Clinical - Order For Equipment >> Oct 31, 2023  1:48 PM Tobias L wrote: Reason for CRM: Patient states he is needing a new CPAP machine. Patient requesting for Dr. Maryanne to send prescription for new CPAP machine to [facility] Fax: [phone].   Please assist pt further

## 2023-10-31 NOTE — Telephone Encounter (Signed)
 Faxed and pt notified via vm. LS

## 2023-11-25 ENCOUNTER — Telehealth (INDEPENDENT_AMBULATORY_CARE_PROVIDER_SITE_OTHER): Admitting: Family Medicine

## 2023-11-25 ENCOUNTER — Encounter: Payer: Self-pay | Admitting: Family Medicine

## 2023-11-25 ENCOUNTER — Telehealth: Payer: Self-pay | Admitting: Family Medicine

## 2023-11-25 DIAGNOSIS — I10 Essential (primary) hypertension: Secondary | ICD-10-CM

## 2023-11-25 DIAGNOSIS — Z1211 Encounter for screening for malignant neoplasm of colon: Secondary | ICD-10-CM

## 2023-11-25 MED ORDER — AMLODIPINE BESYLATE 5 MG PO TABS: 5.0000 mg | ORAL_TABLET | Freq: Every day | ORAL | 1 refills | Status: AC

## 2023-11-25 NOTE — Telephone Encounter (Signed)
 Video visit completed. LS

## 2023-11-25 NOTE — Telephone Encounter (Unsigned)
 Copied from CRM [id number]. Topic: General - Other >> Nov 25, 2023  3:13 PM Zebedee SAUNDERS wrote: Reason for CRM: Pt had video appt with Dr. Maryanne, pt was waiting for an hour, spoke with [facility] stated will call pt, relayed message to pt.

## 2023-11-25 NOTE — Progress Notes (Signed)
   Virtual Visit via MyChart video note  I connected with Larry Paul on 11/25/23 at 1527 by video and verified that I am speaking with the correct person using two identifiers. Larry Paul is currently located at home and patient are currently with her during visit. The provider, Fonda LABOR Detravion Tester, MD is located in their office at time of visit.  Call ended at 1533  I discussed the limitations, risks, security and privacy concerns of performing an evaluation and management service by video and the availability of in person appointments. I also discussed with the patient that there may be a patient responsible charge related to this service. The patient expressed understanding and agreed to proceed.   History and Present Illness: Hypertension Patient is currently on amlodipine , and their blood pressure today is unknown. Patient denies any lightheadedness or dizziness. Patient denies headaches, blurred vision, chest pains, shortness of breath, or weakness. Denies any side effects from medication and is content with current medication.   1. Primary hypertension   2. Colon cancer screening     Outpatient Encounter Medications as of 11/25/2023  Medication Sig   amLODipine  (NORVASC ) 5 MG tablet Take 1 tablet (5 mg total) by mouth daily.   Melatonin 10 MG TABS Take by mouth.   Multiple Vitamin (MULTIVITAMIN) tablet Take 1 tablet by mouth daily.   [DISCONTINUED] amLODipine  (NORVASC ) 5 MG tablet Take 1 tablet (5 mg total) by mouth daily.   No facility-administered encounter medications on file as of 11/25/2023.    Review of Systems  Constitutional:  Negative for chills and fever.  Eyes:  Negative for visual disturbance.  Respiratory:  Negative for shortness of breath and wheezing.   Cardiovascular:  Negative for chest pain and leg swelling.  Skin:  Negative for rash.  Neurological:  Negative for dizziness and light-headedness.  All other systems reviewed and are  negative.   Observations/Objective: Patient sounds comfortable and in no acute distress  Assessment and Plan: Problem List Items Addressed This Visit       Cardiovascular and Mediastinum   HTN (hypertension) - Primary   Relevant Medications   amLODipine  (NORVASC ) 5 MG tablet   Other Visit Diagnoses       Colon cancer screening       Relevant Orders   Ambulatory referral to Gastroenterology        Follow up plan: Return in about 3 months (around 02/25/2024), or if symptoms worsen or fail to improve, for physical for sometime in october.     I discussed the assessment and treatment plan with the patient. The patient was provided an opportunity to ask questions and all were answered. The patient agreed with the plan and demonstrated an understanding of the instructions.   The patient was advised to call back or seek an in-person evaluation if the symptoms worsen or if the condition fails to improve as anticipated.  The above assessment and management plan was discussed with the patient. The patient verbalized understanding of and has agreed to the management plan. Patient is aware to call the clinic if symptoms persist or worsen. Patient is aware when to return to the clinic for a follow-up visit. Patient educated on when it is appropriate to go to the emergency department.    I provided 6 minutes of non-face-to-face time during this encounter.    Fonda LABOR Levins, MD

## 2023-12-02 DIAGNOSIS — G4733 Obstructive sleep apnea (adult) (pediatric): Secondary | ICD-10-CM | POA: Diagnosis not present

## 2024-01-02 DIAGNOSIS — G4733 Obstructive sleep apnea (adult) (pediatric): Secondary | ICD-10-CM | POA: Diagnosis not present

## 2024-02-12 ENCOUNTER — Encounter: Payer: Self-pay | Admitting: Gastroenterology

## 2024-02-21 ENCOUNTER — Other Ambulatory Visit: Payer: Self-pay

## 2024-02-21 ENCOUNTER — Ambulatory Visit: Payer: Self-pay

## 2024-02-21 VITALS — Ht 70.0 in | Wt 270.0 lb

## 2024-02-21 DIAGNOSIS — Z1211 Encounter for screening for malignant neoplasm of colon: Secondary | ICD-10-CM

## 2024-02-21 MED ORDER — NA SULFATE-K SULFATE-MG SULF 17.5-3.13-1.6 GM/177ML PO SOLN: 1.0000 | Freq: Once | ORAL | 0 refills | Status: AC

## 2024-02-21 NOTE — Progress Notes (Signed)
 Denies allergies to eggs or soy products. Denies complication of anesthesia or sedation. Denies use of weight loss medication. Denies use of O2.   Emmi instructions given for colonoscopy.

## 2024-03-02 ENCOUNTER — Encounter: Payer: Self-pay | Admitting: Gastroenterology

## 2024-03-03 DIAGNOSIS — G4733 Obstructive sleep apnea (adult) (pediatric): Secondary | ICD-10-CM | POA: Diagnosis not present

## 2024-03-05 ENCOUNTER — Telehealth: Payer: Self-pay | Admitting: Gastroenterology

## 2024-03-05 NOTE — Telephone Encounter (Signed)
 Returned the patient's phone call. Instructed him to push the clear liquids today. Pt  verbalized understanding.

## 2024-03-05 NOTE — Telephone Encounter (Signed)
 Inbound call from patient stating he is scheduled for a colonoscopy tomorrow at 9:30. He states that he had a salad last night and it had sunflower seeds on it. Patient is requesting a call back to discuss if he can proceed. Please advise.

## 2024-03-06 ENCOUNTER — Encounter: Payer: Self-pay | Admitting: Gastroenterology

## 2024-03-06 ENCOUNTER — Ambulatory Visit (AMBULATORY_SURGERY_CENTER): Payer: Self-pay | Admitting: Gastroenterology

## 2024-03-06 VITALS — BP 124/91 | HR 78 | Temp 97.4°F | Resp 17 | Ht 70.0 in | Wt 270.0 lb

## 2024-03-06 DIAGNOSIS — K562 Volvulus: Secondary | ICD-10-CM | POA: Diagnosis not present

## 2024-03-06 DIAGNOSIS — D123 Benign neoplasm of transverse colon: Secondary | ICD-10-CM

## 2024-03-06 DIAGNOSIS — D122 Benign neoplasm of ascending colon: Secondary | ICD-10-CM

## 2024-03-06 DIAGNOSIS — Z1211 Encounter for screening for malignant neoplasm of colon: Secondary | ICD-10-CM | POA: Diagnosis not present

## 2024-03-06 DIAGNOSIS — D125 Benign neoplasm of sigmoid colon: Secondary | ICD-10-CM

## 2024-03-06 DIAGNOSIS — K635 Polyp of colon: Secondary | ICD-10-CM | POA: Diagnosis not present

## 2024-03-06 DIAGNOSIS — K5730 Diverticulosis of large intestine without perforation or abscess without bleeding: Secondary | ICD-10-CM | POA: Diagnosis not present

## 2024-03-06 DIAGNOSIS — D124 Benign neoplasm of descending colon: Secondary | ICD-10-CM | POA: Diagnosis not present

## 2024-03-06 DIAGNOSIS — K641 Second degree hemorrhoids: Secondary | ICD-10-CM

## 2024-03-06 DIAGNOSIS — D128 Benign neoplasm of rectum: Secondary | ICD-10-CM

## 2024-03-06 MED ORDER — SODIUM CHLORIDE 0.9 % IV SOLN: 500.0000 mL | Freq: Once | INTRAVENOUS | Status: DC

## 2024-03-06 NOTE — Patient Instructions (Signed)
 Resume previous diet Continue present medications Await pathology results See handouts on polyps, diverticulosis and hemorrhoids YOU HAD AN ENDOSCOPIC PROCEDURE TODAY AT THE [facility]:   Refer to the procedure report that was given to you for any specific questions about what was found during the examination.  If the procedure report does not answer your questions, please call your gastroenterologist to clarify.  If you requested that your care partner not be given the details of your procedure findings, then the procedure report has been included in a sealed envelope for you to review at your convenience later.  YOU SHOULD EXPECT: Some feelings of bloating in the abdomen. Passage of more gas than usual.  Walking can help get rid of the air that was put into your GI tract during the procedure and reduce the bloating. If you had a lower endoscopy (such as a colonoscopy or flexible sigmoidoscopy) you may notice spotting of blood in your stool or on the toilet paper. If you underwent a bowel prep for your procedure, you may not have a normal bowel movement for a few days.  Please Note:  You might notice some irritation and congestion in your nose or some drainage.  This is from the oxygen used during your procedure.  There is no need for concern and it should clear up in a day or so.  SYMPTOMS TO REPORT IMMEDIATELY:  Following lower endoscopy (colonoscopy or flexible sigmoidoscopy):  Excessive amounts of blood in the stool  Significant tenderness or worsening of abdominal pains  Swelling of the abdomen that is new, acute  Fever of 100F or higher  For urgent or emergent issues, a gastroenterologist can be reached at any hour by calling [phone]. Do not use MyChart messaging for urgent concerns.   DIET:  We do recommend a small meal at first, but then you may proceed to your regular diet.  Drink plenty of fluids but you should avoid alcoholic beverages for 24 hours.  ACTIVITY:   You should plan to take it easy for the rest of today and you should NOT DRIVE or use heavy machinery until tomorrow (because of the sedation medicines used during the test).    FOLLOW UP: Our staff will call the number listed on your records the next business day following your procedure.  We will call around 7:15- 8:00 am to check on you and address any questions or concerns that you may have regarding the information given to you following your procedure. If we do not reach you, we will leave a message.     If any biopsies were taken you will be contacted by phone or by letter within the next 1-3 weeks.  Please call us  at [phone] if you have not heard about the biopsies in 3 weeks.   SIGNATURES/CONFIDENTIALITY: You and/or your care partner have signed paperwork which will be entered into your electronic medical record.  These signatures attest to the fact that that the information above on your After Visit Summary has been reviewed and is understood.  Full responsibility of the confidentiality of this discharge information lies with you and/or your care-partner.

## 2024-03-06 NOTE — Progress Notes (Signed)
 Called to room to assist during endoscopic procedure.  Patient ID and intended procedure confirmed with present staff. Received instructions for my participation in the procedure from the performing physician.

## 2024-03-06 NOTE — Progress Notes (Signed)
 Report given to PACU, vss

## 2024-03-06 NOTE — Progress Notes (Signed)
 1100 Pt experienced laryngeal spasm with jaw thrust  performed. Nasopharyngeal airway size 7.0  placed without trauma. Robinul 0.2 mg IV given due large amount of secretions upon assessment.  MD made aware.

## 2024-03-06 NOTE — Op Note (Signed)
 [facility] Patient Name: Larry Paul Procedure Date: 03/06/2024 10:41 AM MRN: [id number] Endoscopist: Sandor Flatter , MD, [id number] Age: 53 Referring MD:  Date of Birth: 03-28-1971 Gender: Male Account #: [id number] Procedure:                Colonoscopy Indications:              Screening for colorectal malignant neoplasm, This                            is the patient's first colonoscopy Medicines:                Monitored Anesthesia Care Procedure:                Pre-Anesthesia Assessment:                           - Prior to the procedure, a History and Physical                            was performed, and patient medications and                            allergies were reviewed. The patient's tolerance of                            previous anesthesia was also reviewed. The risks                            and benefits of the procedure and the sedation                            options and risks were discussed with the patient.                            All questions were answered, and informed consent                            was obtained. Prior Anticoagulants: The patient has                            taken no anticoagulant or antiplatelet agents. ASA                            Grade Assessment: III - A patient with severe                            systemic disease. After reviewing the risks and                            benefits, the patient was deemed in satisfactory                            condition to undergo the procedure.  After obtaining informed consent, the colonoscope                            was passed under direct vision. Throughout the                            procedure, the patient's blood pressure, pulse, and                            oxygen saturations were monitored continuously. The                            CF [id number] #[id number] was introduced through the anus                            and advanced to  the the cecum, identified by the                            appendiceal orifice, IC valve and                            transillumination. The colonoscopy was performed                            without difficulty. The patient tolerated the                            procedure well. The quality of the bowel                            preparation was good. The ileocecal valve,                            appendiceal orifice, and rectum were photographed. Scope In: 10:54:04 AM Scope Out: 11:28:17 AM Scope Withdrawal Time: 0 hours 30 minutes 18 seconds  Total Procedure Duration: 0 hours 34 minutes 13 seconds  Findings:                 The perianal and digital rectal examinations were                            normal.                           Three sessile polyps were found in the transverse                            colon and ascending colon. The polyps were 3 to 5                            mm in size. These polyps were removed with a cold                            snare. Resection and retrieval were complete.  Estimated blood loss was minimal.                           A 4 mm polyp was found in the descending colon. The                            polyp was sessile. The polyp was removed with a                            cold snare. Resection and retrieval were complete.                            Estimated blood loss was minimal.                           Seven sessile polyps were found in the sigmoid                            colon. The polyps were 3 to 5 mm in size. These                            polyps were removed with a cold snare. Resection                            and retrieval were complete. Estimated blood loss                            was minimal.                           Seven sessile polyps were found in the rectum. The                            polyps were 2 to 5 mm in size. These polyps were                            removed with a  cold snare. Resection and retrieval                            were complete. Estimated blood loss was minimal.                           Multiple medium-mouthed and small-mouthed                            diverticula were found in the sigmoid colon,                            descending colon and transverse colon.                           Non-bleeding internal hemorrhoids were found during  retroflexion. The hemorrhoids were small and Grade                            II (internal hemorrhoids that prolapse but reduce                            spontaneously).                           The ascending colon revealed moderately excessive                            looping. Advancing the scope required using manual                            pressure and straightening and shortening the scope                            to obtain bowel loop reduction. Complications:            No immediate complications. Estimated Blood Loss:     Estimated blood loss was minimal. Impression:               - Three 3 to 5 mm polyps in the transverse colon                            and in the ascending colon, removed with a cold                            snare. Resected and retrieved.                           - One 4 mm polyp in the descending colon, removed                            with a cold snare. Resected and retrieved.                           - Seven 3 to 5 mm polyps in the sigmoid colon,                            removed with a cold snare. Resected and retrieved.                           - Seven 2 to 5 mm polyps in the rectum, removed                            with a cold snare. Resected and retrieved.                           - Diverticulosis in the sigmoid colon, in the                            descending colon and in the  transverse colon.                           - Non-bleeding internal hemorrhoids.                           - There was significant looping of the  colon. Recommendation:           - Patient has a contact number available for                            emergencies. The signs and symptoms of potential                            delayed complications were discussed with the                            patient. Return to normal activities tomorrow.                            Written discharge instructions were provided to the                            patient.                           - Resume previous diet.                           - Await pathology results.                           - Continue present medications.                           - Repeat colonoscopy for surveillance based on                            pathology results.                           - Return to GI office PRN. Sandor Flatter, MD 03/06/2024 11:39:43 AM

## 2024-03-06 NOTE — Progress Notes (Signed)
 GASTROENTEROLOGY PROCEDURE H&P NOTE   Primary Care Physician: Dettinger, Fonda LABOR, MD    Reason for Procedure:  Colon Cancer screening  Plan:    Colonoscopy  Patient is appropriate for endoscopic procedure(s) in the ambulatory ([facility]) setting.  The nature of the procedure, as well as the risks, benefits, and alternatives were carefully and thoroughly reviewed with the patient. Ample time for discussion and questions allowed. The patient understood, was satisfied, and agreed to proceed. I personally addressed all patient questions and concerns.     HPI: Larry Paul is a 53 y.o. male who presents for colonoscopy for routine Colon Cancer screening.  No active GI symptoms.  No known family history of colon cancer or related malignancy.  Patient is otherwise without complaints or active issues today.  Past Medical History:  Diagnosis Date   Allergy    Arthritis    Hypertension    Obesity    Sleep apnea    CPAP    Past Surgical History:  Procedure Laterality Date   INSERTION OF MESH  06/17/2019   Procedure: INSERTION OF MESH;  Surgeon: Belinda Cough, MD;  Location: [facility];  Service: General;;   NASAL SINUS SURGERY     UMBILICAL HERNIA REPAIR N/A 06/17/2019   Procedure: UMBILICAL HERNIA REPAIR;  Surgeon: Belinda Cough, MD;  Location: [facility];  Service: General;  Laterality: N/A;    Prior to Admission medications   Medication Sig Start Date End Date Taking? Authorizing Provider  amLODipine  (NORVASC ) 5 MG tablet Take 1 tablet (5 mg total) by mouth daily. 11/25/23  Yes Dettinger, Fonda LABOR, MD  Melatonin 10 MG TABS Take by mouth.   Yes [provider]  Multiple Vitamin (MULTIVITAMIN) tablet Take 1 tablet by mouth daily.   Yes [provider]  OVER THE COUNTER MEDICATION OTC, Mushroom supplement. Two capsules per day.   Yes [provider]    Current Outpatient Medications  Medication Sig Dispense Refill    amLODipine  (NORVASC ) 5 MG tablet Take 1 tablet (5 mg total) by mouth daily. 90 tablet 1   Melatonin 10 MG TABS Take by mouth.     Multiple Vitamin (MULTIVITAMIN) tablet Take 1 tablet by mouth daily.     OVER THE COUNTER MEDICATION OTC, Mushroom supplement. Two capsules per day.     Current Facility-Administered Medications  Medication Dose Route Frequency Provider Last Rate Last Admin   0.9 %  sodium chloride infusion  500 mL Intravenous Once Josimar Corning V, DO        Allergies as of 03/06/2024   (No Known Allergies)    Family History  Problem Relation Age of Onset   Diabetes Mother    Arthritis Mother        RHEMATOID   Early death Father        Aspiration   Sudden death Sister        Early 24s   Arthritis Sister    Colon cancer Neg Hx    Esophageal cancer Neg Hx    Rectal cancer Neg Hx    Stomach cancer Neg Hx     Social History   Socioeconomic History   Marital status: Married    Spouse name: Not on file   Number of children: Not on file   Years of education: Not on file   Highest education level: Not on file  Occupational History   Not on file  Tobacco Use   Smoking status: Never  Smokeless tobacco: Never  Substance and Sexual Activity   Alcohol use: No   Drug use: No   Sexual activity: Not on file  Other Topics Concern   Not on file  Social History Narrative   Works in [employer]   Social Drivers of Corporate Investment Banker Strain: Not on file  Food Insecurity: Not on file  Transportation Needs: Not on file  Physical Activity: Not on file  Stress: Not on file  Social Connections: Not on file  Intimate Partner Violence: Not on file    Physical Exam: Vital signs in last 24 hours: @BP  (!) 172/101   Pulse 68   Temp (!) 97.4 F (36.3 C) (Temporal)   Resp 20   Ht 5' 10 (1.778 m)   Wt 270 lb (122.5 kg)   SpO2 96%   BMI 38.74 kg/m  GEN: NAD EYE: Sclerae anicteric ENT: MMM CV: Non-tachycardic Pulm: CTA b/l GI: Soft, NT/ND NEURO:  Alert &  Oriented x 3   Sandor Flatter, DO [facility]   03/06/2024 10:48 AM

## 2024-03-09 ENCOUNTER — Telehealth: Payer: Self-pay

## 2024-03-09 NOTE — Telephone Encounter (Signed)
 Attempted to reach patient for post-procedure f/u call. No answer. Left message for him to please not hesitate to call if he has questions/concerns regarding his care.

## 2024-03-10 LAB — SURGICAL PATHOLOGY

## 2024-03-18 ENCOUNTER — Ambulatory Visit: Payer: Self-pay | Admitting: Gastroenterology
